# Patient Record
Sex: Male | Born: 1965 | Race: Black or African American | Hispanic: No | Marital: Married | Smoking: Never smoker
Health system: Southern US, Community
[De-identification: ages and names within clinical notes are randomized; demographics above are authoritative.]

## PROBLEM LIST (undated history)

## (undated) DIAGNOSIS — I10 Essential (primary) hypertension: Secondary | ICD-10-CM

## (undated) DIAGNOSIS — I2089 Other forms of angina pectoris: Secondary | ICD-10-CM

## (undated) DIAGNOSIS — I208 Other forms of angina pectoris: Secondary | ICD-10-CM

## (undated) HISTORY — DX: Other forms of angina pectoris: I20.8

## (undated) HISTORY — PX: LUMBAR FUSION: SHX111

## (undated) HISTORY — DX: Other forms of angina pectoris: I20.89

---

## 2005-10-05 ENCOUNTER — Encounter: Payer: Self-pay | Admitting: Family Medicine

## 2005-10-23 ENCOUNTER — Encounter: Payer: Self-pay | Admitting: Family Medicine

## 2005-10-24 ENCOUNTER — Encounter: Payer: Self-pay | Admitting: Family Medicine

## 2005-11-18 ENCOUNTER — Encounter: Payer: Self-pay | Admitting: Family Medicine

## 2007-05-17 ENCOUNTER — Ambulatory Visit: Payer: Self-pay | Admitting: Physical Medicine & Rehabilitation

## 2007-08-12 ENCOUNTER — Encounter
Admission: RE | Admit: 2007-08-12 | Discharge: 2007-11-10 | Payer: Self-pay | Admitting: Physical Medicine & Rehabilitation

## 2007-08-12 ENCOUNTER — Ambulatory Visit: Payer: Self-pay | Admitting: Physical Medicine & Rehabilitation

## 2007-08-20 ENCOUNTER — Ambulatory Visit: Payer: Self-pay | Admitting: Physical Medicine & Rehabilitation

## 2007-09-02 ENCOUNTER — Ambulatory Visit: Payer: Self-pay | Admitting: Physical Medicine & Rehabilitation

## 2007-09-09 ENCOUNTER — Ambulatory Visit: Payer: Self-pay | Admitting: Physical Medicine & Rehabilitation

## 2007-10-03 ENCOUNTER — Ambulatory Visit: Payer: Self-pay | Admitting: Physical Medicine & Rehabilitation

## 2007-10-11 ENCOUNTER — Ambulatory Visit: Payer: Self-pay | Admitting: Physical Medicine & Rehabilitation

## 2007-10-17 ENCOUNTER — Encounter
Admission: RE | Admit: 2007-10-17 | Discharge: 2007-11-28 | Payer: Self-pay | Admitting: Physical Medicine & Rehabilitation

## 2007-11-15 ENCOUNTER — Ambulatory Visit: Payer: Self-pay | Admitting: Physical Medicine & Rehabilitation

## 2007-11-29 ENCOUNTER — Ambulatory Visit: Payer: Self-pay | Admitting: Physical Medicine & Rehabilitation

## 2008-02-05 ENCOUNTER — Ambulatory Visit: Payer: Self-pay | Admitting: Family Medicine

## 2008-02-05 DIAGNOSIS — H6120 Impacted cerumen, unspecified ear: Secondary | ICD-10-CM

## 2008-02-05 DIAGNOSIS — H8110 Benign paroxysmal vertigo, unspecified ear: Secondary | ICD-10-CM

## 2008-02-05 DIAGNOSIS — F4320 Adjustment disorder, unspecified: Secondary | ICD-10-CM | POA: Insufficient documentation

## 2008-02-18 ENCOUNTER — Ambulatory Visit (HOSPITAL_COMMUNITY): Payer: Self-pay | Admitting: Psychology

## 2008-02-25 ENCOUNTER — Ambulatory Visit (HOSPITAL_COMMUNITY): Payer: Self-pay | Admitting: Psychology

## 2008-03-04 ENCOUNTER — Ambulatory Visit: Payer: Self-pay | Admitting: Family Medicine

## 2008-03-11 ENCOUNTER — Encounter (INDEPENDENT_AMBULATORY_CARE_PROVIDER_SITE_OTHER): Payer: Self-pay | Admitting: *Deleted

## 2008-03-11 ENCOUNTER — Encounter: Payer: Self-pay | Admitting: Family Medicine

## 2008-03-11 LAB — CONVERTED CEMR LAB
Cholesterol: 182 mg/dL
Triglycerides: 111 mg/dL

## 2008-03-12 ENCOUNTER — Encounter: Payer: Self-pay | Admitting: Family Medicine

## 2008-03-12 LAB — CONVERTED CEMR LAB
Albumin: 4.4 g/dL
BUN: 11 mg/dL
CO2: 30 meq/L
Calcium: 10.1 mg/dL
Chloride: 104 meq/L
Creatinine, Ser: 1.3 mg/dL
Potassium: 4.1 meq/L

## 2008-03-16 ENCOUNTER — Encounter: Payer: Self-pay | Admitting: Family Medicine

## 2008-03-24 ENCOUNTER — Ambulatory Visit (HOSPITAL_COMMUNITY): Payer: Self-pay | Admitting: Psychology

## 2008-03-24 ENCOUNTER — Encounter: Payer: Self-pay | Admitting: Family Medicine

## 2008-03-27 ENCOUNTER — Ambulatory Visit: Payer: Self-pay | Admitting: Family Medicine

## 2008-03-27 DIAGNOSIS — A499 Bacterial infection, unspecified: Secondary | ICD-10-CM

## 2008-04-03 ENCOUNTER — Encounter: Payer: Self-pay | Admitting: Family Medicine

## 2008-04-24 ENCOUNTER — Ambulatory Visit: Payer: Self-pay | Admitting: Physical Medicine & Rehabilitation

## 2008-05-11 ENCOUNTER — Encounter
Admission: RE | Admit: 2008-05-11 | Discharge: 2008-05-25 | Payer: Self-pay | Admitting: Physical Medicine & Rehabilitation

## 2008-05-15 ENCOUNTER — Encounter
Admission: RE | Admit: 2008-05-15 | Discharge: 2008-05-18 | Payer: Self-pay | Admitting: Physical Medicine & Rehabilitation

## 2008-05-18 ENCOUNTER — Ambulatory Visit: Payer: Self-pay | Admitting: Physical Medicine & Rehabilitation

## 2008-07-07 ENCOUNTER — Ambulatory Visit (HOSPITAL_COMMUNITY): Payer: Self-pay | Admitting: Psychology

## 2008-07-31 ENCOUNTER — Ambulatory Visit: Payer: Self-pay | Admitting: Physical Medicine & Rehabilitation

## 2008-08-19 ENCOUNTER — Ambulatory Visit: Payer: Self-pay | Admitting: Family Medicine

## 2008-08-19 DIAGNOSIS — J069 Acute upper respiratory infection, unspecified: Secondary | ICD-10-CM | POA: Insufficient documentation

## 2009-04-13 ENCOUNTER — Encounter: Payer: Self-pay | Admitting: Family Medicine

## 2009-06-24 ENCOUNTER — Ambulatory Visit: Payer: Self-pay | Admitting: Family Medicine

## 2009-06-24 DIAGNOSIS — D239 Other benign neoplasm of skin, unspecified: Secondary | ICD-10-CM | POA: Insufficient documentation

## 2009-06-28 ENCOUNTER — Encounter (INDEPENDENT_AMBULATORY_CARE_PROVIDER_SITE_OTHER): Payer: Self-pay | Admitting: *Deleted

## 2009-08-16 ENCOUNTER — Telehealth (INDEPENDENT_AMBULATORY_CARE_PROVIDER_SITE_OTHER): Payer: Self-pay | Admitting: *Deleted

## 2009-08-17 ENCOUNTER — Ambulatory Visit: Payer: Self-pay | Admitting: Family Medicine

## 2009-08-17 DIAGNOSIS — M545 Low back pain: Secondary | ICD-10-CM

## 2009-08-24 ENCOUNTER — Encounter: Admission: RE | Admit: 2009-08-24 | Discharge: 2009-09-23 | Payer: Self-pay | Admitting: Family Medicine

## 2009-08-30 ENCOUNTER — Encounter: Payer: Self-pay | Admitting: Family Medicine

## 2009-09-23 ENCOUNTER — Encounter: Payer: Self-pay | Admitting: Family Medicine

## 2009-10-13 ENCOUNTER — Ambulatory Visit: Payer: Self-pay | Admitting: Family

## 2009-10-13 DIAGNOSIS — H6690 Otitis media, unspecified, unspecified ear: Secondary | ICD-10-CM | POA: Insufficient documentation

## 2009-10-26 ENCOUNTER — Ambulatory Visit: Payer: Self-pay | Admitting: Family Medicine

## 2009-11-19 ENCOUNTER — Encounter: Payer: Self-pay | Admitting: Family Medicine

## 2010-01-28 ENCOUNTER — Ambulatory Visit: Payer: Self-pay | Admitting: Family Medicine

## 2010-01-28 DIAGNOSIS — E559 Vitamin D deficiency, unspecified: Secondary | ICD-10-CM | POA: Insufficient documentation

## 2010-01-28 DIAGNOSIS — R42 Dizziness and giddiness: Secondary | ICD-10-CM | POA: Insufficient documentation

## 2010-02-11 ENCOUNTER — Encounter: Payer: Self-pay | Admitting: Family Medicine

## 2010-02-14 LAB — CONVERTED CEMR LAB
Alkaline Phosphatase: 45 units/L (ref 39–117)
CO2: 29 meq/L (ref 19–32)
Cholesterol: 182 mg/dL (ref 0–200)
Creatinine, Ser: 1.33 mg/dL (ref 0.40–1.50)
Glucose, Bld: 112 mg/dL — ABNORMAL HIGH (ref 70–99)
HCT: 44.7 % (ref 39.0–52.0)
LDL Cholesterol: 121 mg/dL — ABNORMAL HIGH (ref 0–99)
MCHC: 34 g/dL (ref 30.0–36.0)
MCV: 88.3 fL (ref 78.0–100.0)
Platelets: 207 10*3/uL (ref 150–400)
RBC: 5.06 M/uL (ref 4.22–5.81)
Sodium: 142 meq/L (ref 135–145)
Total Bilirubin: 0.7 mg/dL (ref 0.3–1.2)
Total CHOL/HDL Ratio: 4.4
Total Protein: 6.9 g/dL (ref 6.0–8.3)
Triglycerides: 101 mg/dL (ref <150)
VLDL: 20 mg/dL (ref 0–40)

## 2010-06-07 NOTE — Miscellaneous (Signed)
Summary: PT Initial Summary/[facility]  PT Initial Summary/[facility]   Imported By: Lanelle Bal 09/02/2009 11:20:34  _____________________________________________________________________  External Attachment:    Type:   Image     Comment:   External Document

## 2010-06-07 NOTE — Assessment & Plan Note (Signed)
Summary: back pain   Vital Signs:  Patient profile:   45 year old male Height:      70.25 inches Weight:      198 pounds BMI:     28.31 O2 Sat:      99 % on Room air Pulse rate:   74 / minute BP sitting:   143 / 94  (left arm) Cuff size:   large  Vitals Entered By: Payton Spark CMA (August 17, 2009 11:14 AM)  O2 Flow:  Room air CC: Referral back to PT. Had back injury at work 11/02/2005 was seeing [facility] but self referred himself to Dr. Bryson Dames who then referred him to Leeanne Mannan. , Back Pain   Primary Care Provider:  Seymour Bars DO  CC:  Referral back to PT. Had back injury at work 11/02/2005 was seeing [facility] but self referred himself to Dr. Bryson Dames who then referred him to Leeanne Mannan.  and Back Pain.  History of Present Illness: 45 yo AAM presents for LBP injury in 2007 --> out of work until 2009.  Phone system and box fell on him. He was treated with injection (with Dr Jess Barters) which did not seem to help.  He did PT off and on at [facility] then with [facility].  He stopped PT the end of 2009.  He would like to restart PT. He went back to work as light duty in 09.  He still has some tender spots and weakness.  He also wants to start working out.  He would like to get back to full duty at work.    He is not taking any Tylenol/ NSAIDs or muscle relaxers.       This is a 45 year old man who presents with Back Pain.  The patient reports weakness, but denies fever, urinary incontinence, urinary retention, and dysuria.  The pain is located in the right low back and left low back.  The pain began at work and after a blow.  The pain is made better by inactivity.  Risk factors for serious underlying conditions include duration of pain > 1 month.    Current Medications (verified): 1)  None  Allergies (verified): No Known Drug Allergies  Past History:  Past Medical History: Reviewed history from 04/03/2008 and no changes required. Lumbar back pain hx  of 'syndrome x'  cards Dr Salomon Mast GC/Chl/RPR/HEPC/ HIV neg 11-09  exercise stress test 6-07: neg for ischemia, but + for CP.  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  Past Surgical History: Reviewed history from 02/05/2008 and no changes required. 3-06 lumbar diskectomy  Social History: Reviewed history from 02/05/2008 and no changes required. 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 Married to [place] with 4 kids. Never smoked.  Denies ETOH. No regular exercise. Fair diet. Not sexually active, wants STD testing.  Review of Systems      See HPI  Physical Exam  General:  alert, well-developed, well-nourished, and well-hydrated.   Head:  normocephalic and atraumatic.   Mouth:  pharynx pink and moist.   Neck:  full ROM and no masses.   Lungs:  Normal respiratory effort, chest expands symmetrically. Lungs are clear to auscultation, no crackles or wheezes. Heart:  Normal rate and regular rhythm. S1 and S2 normal without gallop,  murmur, click, rub or other extra sounds. Extremities:  no LE edema Neurologic:  DTRs symmetrical and normal.     Detailed Back/Spine Exam  Gait:    Normal heel-toe gait pattern bilaterally.    Lumbosacral Exam:  Inspection-deformity:    Normal Palpation-spinal tenderness:     tender midline at L5-S1 Range of Motion:    Forward Flexion:   80 degrees    Hyperextension:   20 degrees    Right Lateral Bend:   35 degrees    Left Lateral Bend:   35 degrees Sitting Straight Leg Raise:    Right:  negative    Left:  negative Sciatic Notch:    There is no sciatic notch tenderness. Toe Walking:    Right:  normal    Left:  normal Heel Walking:    Right:  normal    Left:  normal   Impression & Recommendations:  Problem # 1:  LOW BACK PAIN,  CHRONIC (ICD-724.2) Complicated by hx of lumbar diskectomy and traumatic incident at work in 07 which left him out of work for 2 yrs.  He did not improve after several LESIs with Dr Jess Barters (had updated MRI at the time ? 2008).  He continues to have some LBP w/o radiculopathy that is impairing ability to return to full duty.  He has not done home PT exercises.  He declined need for pain meds or muscle relaxers.  He did improve with PT, last done in 09.  Will restart PT for another 8 wks then eval again to see if he can go to full duty at work.  aVoid heavy lifting.  He really needs to adopt some low back exercises and core strengthening program for home use.   Orders: Physical Therapy Referral (PT)  Patient Instructions: 1)  referral put in for PT with Fannie Knee. 2)  You will get a call to set this up. 3)  Return to recheck in 8 wks -- will see if you are ready for FULL duty at work.

## 2010-06-07 NOTE — Consult Note (Signed)
Summary: [facility]  [facility]   Imported By: Lanelle Bal 11/26/2009 14:09:48  _____________________________________________________________________  External Attachment:    Type:   Image     Comment:   External Document

## 2010-06-07 NOTE — Assessment & Plan Note (Signed)
Summary: FEELING DIZZY--STARTED FRIDAY//VGJ--Rm 6   Vital Signs:  Patient profile:   45 year old male Height:      70.25 inches Weight:      194 pounds BMI:     27.74 Temp:     97.6 degrees F oral Pulse rate:   78 / minute Pulse rhythm:   regular Resp:     16 per minute BP sitting:   110 / 86  (right arm) Cuff size:   regular  Vitals Entered By: Mervin Kung CMA (October 13, 2009 10:45 AM) CC: Room 6  Dizziness with certain movements since Friday. Persistent nausea since Friday.   Primary Care Provider:  Seymour Bars DO  CC:  Room 6  Dizziness with certain movements since Friday. Persistent nausea since Friday.Bryan Walls  History of Present Illness: Bryan Walls is a 45 year old male who presents today with complaint of dizziness which started last friday.  Symptoms are worse with movement and are associated with constant nausea. Denies associated sinus pressure or ear pain.  He had similar episode last year for which he saw Dr. Cathey Endow, though it did not last this long.  Patient was treated with meclizine at that time which he did not feel helped him that much.  Denies associated weakness or numbness.    Allergies (verified): No Known Drug Allergies  Physical Exam  General:  Well-developed,well-nourished,in no acute distress; alert,appropriate and cooperative throughout examination Head:  Normocephalic and atraumatic without obvious abnormalities. No apparent alopecia or balding. Ears:  + red streaking on right TM, L TM normal Lungs:  Normal respiratory effort, chest expands symmetrically. Lungs are clear to auscultation, no crackles or wheezes. Heart:  Normal rate and regular rhythm. S1 and S2 normal without gallop, murmur, click, rub or other extra sounds. Neurologic:  No cranial nerve deficits noted. Station and gait are normal. Plantar reflexes are down-going bilaterally. DTRs are symmetrical throughout. Sensory, motor and coordinative functions appear intact.   Impression &  Recommendations:  Problem # 1:  BENIGN POSITIONAL VERTIGO (ICD-386.11) Assessment Deteriorated Will give trial of meclizine, and as needed zofran.  If no improvement with these measures, can consider referral to vestibular rehab. His updated medication list for this problem includes:    Meclizine Hcl 25 Mg Tabs (Meclizine hcl) ..... One tablet by mouth three times a day as needed for dizziness    Ondansetron Hcl 4 Mg Tabs (Ondansetron hcl) ..... One tablet by mouth three times a day as needed nausea  Problem # 2:  OTITIS MEDIA (ICD-382.9) Assessment: New Suspect mild R OM.  Give sxs of #1 will plan to treat with amoxicillin. His updated medication list for this problem includes:    Amoxicillin 500 Mg Caps (Amoxicillin) ..... One tablet by mouth three times a day x 10 days  Complete Medication List: 1)  Meclizine Hcl 25 Mg Tabs (Meclizine hcl) .... One tablet by mouth three times a day as needed for dizziness 2)  Amoxicillin 500 Mg Caps (Amoxicillin) .... One tablet by mouth three times a day x 10 days 3)  Ondansetron Hcl 4 Mg Tabs (Ondansetron hcl) .... One tablet by mouth three times a day as needed nausea  Patient Instructions: 1)  Please call if your symptoms worsen or do not improve. Prescriptions: ONDANSETRON HCL 4 MG TABS (ONDANSETRON HCL) one tablet by mouth three times a day as needed nausea  #30 x 0   Entered and Authorized by:   Lemont Fillers FNP   Signed  by:   Lemont Fillers FNP on 10/13/2009   Method used:   Electronically to        [facility] (retail)       [address]       [place], [place]  [address]       Ph: [phone]       Fax: [phone]   RxID:   [id number] AMOXICILLIN 500 MG CAPS (AMOXICILLIN) one tablet by mouth three times a day x 10 days  #30 x 0   Entered and Authorized by:   Lemont Fillers FNP   Signed by:   Lemont Fillers FNP on 10/13/2009   Method used:   Electronically to        [facility] (retail)       [address]       [place], [place]  [address]       Ph: [phone]       Fax: [phone]   RxID:   [id number] MECLIZINE HCL 25 MG TABS (MECLIZINE HCL) one tablet by mouth three times a day as needed for dizziness  #30 x 0   Entered and Authorized by:   Lemont Fillers FNP   Signed by:   Lemont Fillers FNP on 10/13/2009   Method used:   Electronically to        [facility] (retail)       [address]       [place], [place]  [address]       Ph: [phone]       Fax: [phone]   RxID:   [id number]   Current Allergies (reviewed today): No known allergies

## 2010-06-07 NOTE — Letter (Signed)
Summary: Primary Care Consult Scheduled Letter  [facility]  [address]   [place], [place] [address]   Phone: [phone]  Fax: [phone]      06/28/2009 MRN: [id number]  NYHEEM BINETTE [address] [place], [place]  [address]    Dear Mr. Buzan,      We have scheduled an appointment for you.  At the recommendation of Dr.BOWEN , we have scheduled you a consult with [facility]  [address] DR Katrinka Blazing  on FEBRUARY 28,2011 at 9:45AM.  Their address is[address], [place] . The office phone number is [phone].  If this appointment day and time is not convenient for you, please feel free to call the office of the doctor you are being referred to at the number listed above and reschedule the appointment.     It is important for you to keep your scheduled appointments. We are here to make sure you are given good patient care. If you have questions or you have made changes to your appointment, please notify us at  [phone], ask for HELEN.    Thank you,  Patient Care Coordinator [place] [facility]

## 2010-06-07 NOTE — Progress Notes (Signed)
Summary: PT referral   Phone Note Call from Patient   Caller: Patient [phone] Summary of Call: Pt would like referral to PT in our building. He was working w/ Fannie Knee in the past for a work related low back injury and feels he needs to continue.  Initial call taken by: Payton Spark CMA,  August 16, 2009 9:08 AM  Follow-up for Phone Call        I cannot refer him if I did not seem him for back pain. Follow-up by: Seymour Bars DO,  August 16, 2009 9:10 AM  Additional Follow-up for Phone Call Additional follow up Details #1::        Explained to Pt that we do not bill to workers comp. Pt then transferred to [facility] so she could further explain to Pt.  Additional Follow-up by: Payton Spark CMA,  August 16, 2009 9:42 AM

## 2010-06-07 NOTE — Assessment & Plan Note (Signed)
Summary: CPE   Vital Signs:  Patient profile:   45 year old male Height:      70.25 inches Weight:      195 pounds BMI:     27.88 O2 Sat:      98 % on Room air Pulse rate:   78 / minute BP sitting:   132 / 85  (left arm) Cuff size:   large  Vitals Entered By: Payton Spark CMA (January 28, 2010 4:02 PM)  O2 Flow:  Room air CC: CPE   Primary Care Kaysha Parsell:  Seymour Bars DO  CC:  CPE.  History of Present Illness: 45 yo WM presents for CPE.    He has no complaints today other than stress - related dyspepsia x 1 month and some recurring BPPV which he has been seeing Dr Arville Lime for at [facility].Marland Kitchen  Working a lot.  Hx of LBP from work related accident a few years ago.  He is due for fasting labs.  Denies fam hx of prostate or colon cancer or of premature heart dz.   Current Medications (verified): 1)  None  Allergies (verified): No Known Drug Allergies  Past History:  Past Medical History: Reviewed history from 04/03/2008 and no changes required. Lumbar back pain hx of 'syndrome x'  cards Dr Salomon Mast GC/Chl/RPR/HEPC/ HIV neg 11-09  exercise stress test 6-07: neg for ischemia, but + for CP.  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  Past Surgical History: Reviewed history from 02/05/2008 and no changes required. 3-06 lumbar diskectomy  Family History: Reviewed history from 03/04/2008 and no changes required. mother died cervical cancer, had diabetes father HTN 2 brothers healthy  Social History: Reviewed history from 02/05/2008 and no changes required. 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 Never smoked.  Denies ETOH. No regular exercise. Fair diet.  Review of Systems  The patient denies anorexia, fever, weight loss,  weight gain, vision loss, decreased hearing, hoarseness, chest pain, syncope, dyspnea on exertion, peripheral edema, prolonged cough, headaches, hemoptysis, abdominal pain, melena, hematochezia, severe indigestion/heartburn, hematuria, incontinence, genital sores, muscle weakness, suspicious skin lesions, transient blindness, difficulty walking, depression, unusual weight change, abnormal bleeding, enlarged lymph nodes, angioedema, breast masses, and testicular masses.    Physical Exam  General:  alert, well-developed, well-nourished, and well-hydrated.   Head:  normocephalic and atraumatic.   Eyes:  PERRLA Ears:  EACs patent; TMs translucent and gray with good cone of light and bony landmarks.  Nose:  no nasal discharge.   Mouth:  good dentition and pharynx pink and moist.   Neck:  no masses.   Lungs:  Normal respiratory effort, chest expands symmetrically. Lungs are clear to auscultation, no crackles or wheezes. Heart:  Normal rate and regular rhythm. S1 and S2 normal without gallop, murmur, click, rub or other extra sounds. Abdomen:  Bowel sounds positive,abdomen soft and non-tender without masses, organomegaly, no AA bruits Rectal:  small external hemorrhoids,  Normal sphincter tone. No rectal masses or tenderness. hemoccult neg.   Prostate:  Prostate gland firm and smooth, no enlargement, nodularity, tenderness, mass, asymmetry or induration. Pulses:  2+ radial and pedal pulses Extremities:  no LE edema Skin:  color normal and no suspicious lesions.   Cervical Nodes:  No lymphadenopathy noted Psych:  good eye contact,  not anxious appearing, and not depressed appearing.     Impression & Recommendations:  Problem # 1:  HEALTH MAINTENANCE EXAM (ICD-V70.0) Keeping healthy checklist for men reviewed.   BP at goal.  BmI 27 = overwt. Update fasting labs. Tdap is UTD. DRE normal.  PSA with fasting labs. Trial of Acciphex for dyspepsia, if not improved in 2 wks, will call. He is to f/u  with Dr Arville Lime for his BPPV.  Other Orders: T-CBC No Diff ([id number]) T-Comprehensive Metabolic Panel [id number]) T-Lipid Profile [id number]) T-PSA ([id number]) T-Vitamin D (25-Hydroxy) ([id number]) Hemoccult Guaiac-1 spec.(in office) (02725)  Patient Instructions: 1)  Update fasting labs. 2)  Will call you w/ results. 3)  Try Eppley Maneuver at home for BPPV and f/u with Dr Arville Lime if dizziness continues. 4)  Try Acciphex once daily for dyspepsia. 5)  If abdominal pain has not improved in 2 wks, call me back.

## 2010-06-07 NOTE — Assessment & Plan Note (Signed)
Summary: scar   Vital Signs:  Patient profile:   45 year old male Height:      70.25 inches Weight:      194 pounds BMI:     27.74 O2 Sat:      97 % on Room air Temp:     98.3 degrees F oral Pulse rate:   86 / minute BP sitting:   127 / 82  (right arm) Cuff size:   regular  Vitals Entered By: Payton Spark CMA/April (June 24, 2009 3:27 PM)  O2 Flow:  Room air CC: f/u on mole on leg   Primary Care Provider:  Seymour Bars DO  CC:  f/u on mole on leg.  History of Present Illness: 45 yo AAM presents for f/u a skin lesion on his L calf that started 5 yrs ago after he got cut by an escalator.  The scar has gradually increased in size and is a bit itchy.  He denies any drainage.  Denies a hx of keloids or hypertrophic scars.    Current Medications (verified): 1)  Hydrocodone-Homatropine 5-1.5 Mg/22ml Syrp (Hydrocodone-Homatropine) .... 5 Ml By Mouth At Bedtime As Needed Cough  Allergies (verified): No Known Drug Allergies  Past History:  Past Medical History: Reviewed history from 04/03/2008 and no changes required. Lumbar back pain hx of 'syndrome x'  cards Dr Salomon Mast GC/Chl/RPR/HEPC/ HIV neg 11-09  exercise stress test 6-07: neg for ischemia, but + for CP.  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  Social History: Reviewed history from 02/05/2008 and no changes required. 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 Never smoked.  Denies ETOH. No regular exercise. Fair diet. Not sexually active, wants STD testing.  Review of Systems      See HPI  Physical Exam  General:  alert, well-developed, well-nourished, and well-hydrated.   Skin:  1 x 1 raised hyperpigmented hard 'scar' lesion over the proximal L calf with dryness and  central crater.     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 Send to derm for excisional removal.   Orders: Dermatology Referral (Derma)  Complete Medication List: 1)  Hydrocodone-homatropine 5-1.5 Mg/9ml Syrp (Hydrocodone-homatropine) .... 5 ml by mouth at bedtime as needed cough

## 2010-06-07 NOTE — Assessment & Plan Note (Signed)
Summary: f/u BPPV/ LBP   Vital Signs:  Patient profile:   45 year old male Height:      70.25 inches Weight:      200 pounds BMI:     28.60 O2 Sat:      100 % on Room air Pulse rate:   66 / minute BP sitting:   128 / 90  (left arm) Cuff size:   large  Vitals Entered By: Payton Spark CMA (October 26, 2009 8:27 AM)  O2 Flow:  Room air CC: F/U back pain. Doing better.    Primary Care Provider:  Seymour Bars DO  CC:  F/U back pain. Doing better. Marland Kitchen  History of Present Illness: 45 yo AAM presents for f/u LBP flare up and BPPV.    He was treated for serous OM and BPPV with only meclizine in early June.  He still has dizziness now that is reproducible.  He denied having a viral infection prior to this.  He has had this several x's in the past.  Denies any HAs, blurry vision or nausea/ vomitting.  Sitting still helps the rotational vertigo go away.  The Abx did not seem to help.  Denies any neck pain.  He completed PT for his LBP which did help.  He plans to start working out after his BPPV improves.  He is off all meds for back pain.    Current Medications (verified): 1)  None  Allergies (verified): No Known Drug Allergies  Past History:  Past Medical History: Reviewed history from 04/03/2008 and no changes required. Lumbar back pain hx of 'syndrome x'  cards Dr Salomon Mast GC/Chl/RPR/HEPC/ HIV neg 11-09  exercise stress test 6-07: neg for ischemia, but + for CP.  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  Past Surgical History: Reviewed history from 02/05/2008 and no changes required. 3-06 lumbar diskectomy  Social History: Reviewed history from 02/05/2008 and no changes required. 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 Never smoked.   Denies ETOH. No regular exercise. Fair diet. Not sexually active, wants STD testing.  Review of Systems      See HPI  Physical Exam  General:  alert, well-developed, well-nourished, and well-hydrated.   Head:  normocephalic and atraumatic.   Eyes:  pupils equal, pupils round, and pupils reactive to light.   Ears:  EACs patent; TMs translucent and gray with good cone of light and bony landmarks.  Nose:  no nasal discharge.   Mouth:  pharynx pink and moist.   Neck:  supple, full ROM, and no masses.   Lungs:  Normal respiratory effort, chest expands symmetrically. Lungs are clear to auscultation, no crackles or wheezes. Heart:  Normal rate and regular rhythm. S1 and S2 normal without gallop, murmur, click, rub or other extra sounds. Msk:  full active L spine ROM with neg seated straight leg raise, normal gait Extremities:  no LE edema Neurologic:  gait normal and DTRs symmetrical and normal.   + Producer, television/film/video with head rotation to the R and neck extenstion -- dizziness and horizontal nystagmus.   Skin:  color normal.     Impression & Recommendations:  Problem # 1:  BENIGN POSITIONAL VERTIGO (ICD-386.11) REcurring BPPV, this being the longest and most severe episode x 10 days  now. Able to reproduce with Gilberto Better today.  Given h/o on Eppley manuever to do at home for treatment.  Already has RX meds below. Due to recurrence and dizziness with neck flexion also, will send to ENT for further eval and tx. The following medications were removed from the medication list:    Meclizine Hcl 25 Mg Tabs (Meclizine hcl) ..... One tablet by mouth three times a day as needed for dizziness    Ondansetron Hcl 4 Mg Tabs (Ondansetron hcl) ..... One tablet by mouth three times a day as needed nausea  Orders: ENT Referral (ENT)  Problem # 2:  LOW BACK PAIN, CHRONIC (ICD-724.2) Acute on chronic LBP much improved after PT --> needs to continue home rehab program. Off all RX  meds.  Patient Instructions: 1)  ENT referral made for BPPV. 2)  OK to use your meclizine and do the Eppley maneuvers at home. 3)  Continue home rehab program for chronic back pain.   4)  Return for a PHYSICAL in 3 mos.

## 2010-06-07 NOTE — Miscellaneous (Signed)
Summary: PT Discharge/[facility]  PT Discharge/[facility]   Imported By: Lanelle Bal 10/11/2009 09:11:58  _____________________________________________________________________  External Attachment:    Type:   Image     Comment:   External Document

## 2010-09-20 NOTE — Procedures (Signed)
Bryan Walls, Bryan Walls             ACCOUNT NO.:  [id number]   MEDICAL RECORD NO.:  [id number]          PATIENT TYPE:  REC   LOCATION:  [facility]                          FACILITY:  [facility]   PHYSICIAN:  Erick Colace, M.D.DATE OF BIRTH:  1965-10-17   DATE OF PROCEDURE:  09/02/2007  DATE OF DISCHARGE:                               OPERATIVE REPORT   PROCEDURE:  Bilateral L5 dorsal ramus injection, bilateral L4 medial  branch block, bilateral L3 medial branch block under lumbar fluoroscopic  guidance and sacral fluoroscopic guidance.   INDICATIONS:  Lumbar axial pain, lumbar spondylosis without myelopathy.   The pain is only partially responsive to medication management and other  conservative care. Recent MRI demonstrating annular tear, question  whether pain is diskogenic versus facetogenic.   Informed consent was obtained after describing the risks and benefits of  the procedure with the patient. These include bleeding, bruising,  infection, temporary or permanent paralysis and he elects proceed.  The  patient placed prone on fluoroscopy table.  Betadine prep, sterile  drape, a 25-gauge inch and a half needle was used to anesthetize the  skin and subcutaneous tissue with 1% lidocaine x2 mLs at each of 6  sites.  Then a 22-gauge 3-1/2 inch spinal needle was inserted,  first  starting at the left S1 SAP sacral ala, bone contact made and confirmed  with lateral imaging.  Omnipaque 180 x 0.5 mL demonstrated no  intravascular uptake and 0.5 mL of dexamethasone lidocaine solution  injected. The solution containing 1 mL of 4 mg per mL dexamethasone and  2 mL of 2%MPF lidocaine.  In the left L5 SAP, the transverse process  junction was targeted, bone contact made and confirmed with lateral  imaging.  Omnipaque 180 x 0.5 mL demonstrated no intravascular uptake  and then 0.5 mL of dexamethasone lidocaine solution was injected.  The  left L4 SAP transverse process junction targeted, bone  contact made,  confirmed with lateral imaging.  Omnipaque 180 x 0.5 mL demonstrated no  intravascular uptake and 0.5 mL of the dexamethasone lidocaine solution  was injected.  The same procedure was repeated at corresponding levels  on the right side using the same needle injectate and at technique. If  this was of no benefit, consider epidural next visit translaminar  targeting the annular tear level.   Will start some physical therapy in the meantime.      Erick Colace, M.D.  Electronically Signed     AEK/MEDQ  D:  09/02/2007 13:04:24  T:  09/02/2007 13:19:49  Job:  [id number]

## 2010-09-20 NOTE — Assessment & Plan Note (Signed)
A 45 year old male with lumbar post laminectomy syndrome as well as  thoracolumbar myofascial pain syndrome.  I last saw him on May 18, 2008.  He has been trying to work out a way to work at home rather than  having to drive.  He states that driving aggravates his pain.  He states  that using his sick leave time and other free time to breakup full work  week has been helpful for him.  He has had no new injuries.  No new  medical problems.  He states he is still on the workers compensation  system.   In the interval time, I also talked to a physician from the insurance  company and he asked me whether or not getting out and stretching during  driving to work would allow the patient to be more comfortable.  I did  indicate yes from a medical standpoint.   His average pain at last visit was 7, this time he states it is at 10.  He does appear comfortable; however, no acute distress, moving about the  same as last time.  His pain interference score is at 0.   Pain will improve with rest and exercise.   He is not on any prescription pain medications.  He states that he sees  psychologist for behavioral health issues.   His Oswestry score today 52%.   EXAMINATION:  No acute distress.  Mood and affect appropriate.  Gait is  normal.  He can now walk toe walk and heel walk.   His motor strength is full in the upper and lower extremities.  Deep  tendon reflexes are normal.  His back has some tenderness to palpation  in the lumbosacral junction.   IMPRESSION:  1. Lumbar post laminectomy syndrome with chronic pain.  2. Thoracolumbar myofascial pain syndrome causing muscular pain as      well.  I will not make any changes in his restrictions.  I did      indicate to him that.  There has really been no change in his      clinical status for quite some time.  He is to continue on his home      exercise program.  Getting out and stretching every 20-30 minutes      with driving may be  helpful in this situation.   I will see him back on a p.r.n. basis.      Erick Colace, M.D.  Electronically Signed     AEK/MedQ  D:  07/31/2008 12:06:51  T:  08/01/2008 01:44:41  Job #:  [id number]   cc:   Zelphia Cairo. Alto Denver, M.D.  Fax: [phone]

## 2010-09-20 NOTE — Assessment & Plan Note (Signed)
HISTORY OF PRESENT ILLNESS:  Bryan Walls is a 45 year old male with  lumbar postlaminectomy syndrome.  He has back pain dating back 20 years  due to [employer] accident.  He was treated in the 1990s with epidural  injections and other conservative care.  He did have another episode of  increased back and leg pain in 2006, showing very large extruded disk L5-  S1.  Underwent discectomy in March 2006.  Follow up MRI in October 2006  demonstrated disk bulge at L4-L5 and L5-S1 with removal of the prior  disk extrusion.  He returned to work light duty in November 2006, but  had another back injury seven months later in June 2007 when a phone  system fell on top of him.  He was going through physical rehabilitation  versed in aquatic, and just was starting land-based therapy.  This was  done in conjunction with acupuncture treatments, and was doing fairly  well prior to going out of state visiting his mother who is requiring  physical assistance due to health problems of her own.  He returned to  visit me approximately three months later stating that he had an  exacerbation in his low back pain.  When I saw him, he had marked  limitation of getting in and out of the chair, walking very slowly,  tenderness with light palpation in the lumbar and sacral areas and  reduction of range of motion 25%.  He did not have any focal  neurological changes.  No signs of radiculopathy, but because of the  abrupt change, I did order MRI of his lumbar spine, and he is here to  review this with me today.  Since I last saw him approximately one week  ago, his pain has remained at a 10/10 level.  He has been using Flexeril  and Naprosyn for pain.   PHYSICAL EXAMINATION:  VITAL SIGNS:  Blood pressure 127/79, pulse 75,  respirations 18, O2 saturation 100% on room air.  GENERAL:  No acute distress.  Mood and affect mildly anxious.  Back has  tenderness to palpation of lumbar paraspinals.  His range of motion  limitation is 25% forward flexion/extension, lateral rotation and  bending.  Motor strength is 4+ bilateral hip flexor, knee extensor,  ankle dorsiflexor.  Knee and ankle range of motion and hip range of  motion are normal.  Straight leg raising test is negative.  Deep tendon  reflexes are normal.   STUDIES:  Review of lumbar MRI, I had the report, not actual films, L3-4  minor diffuse spondylotic disk bulge.  He does have short pedicles, mild  central stenosis, and mild left foraminal narrowing at L4-5, mild  diffuse spondylytic disk bulging, small hyperintense zone, posterior  central annulus consistent with annular tear with some mild foraminal  and lateral recess narrowing at L5-S1 evidence of prior right  hemilaminectomy and discectomy.  Postoperative scar abutting right  transversion of S1 nerve root.  Moderate bilateral neural foraminal  narrowing.  I do have the report from his lumbar myelogram February 20, 2006, which is his previous most recent imaging study.  Given that this  was a CT myelogram and not an MRI, difficult to make direct comparison.  Facet changes were noted.  Mild to moderate central canal stenosis noted  at L3-4, facet changes noted at L4-5 and here it was read as moderate to  severe central canal stenosis.  L5-S1 postoperative changes, pushing  upon right S1nerve root.   Overall,  only new finding is the annular defect.  These are at times  asymptomatic but also can be associated with pain which can last up to a  couple of years.  There is no way of saying that whether the patient's  current exacerbation is due to annular tear.  His examination has  combined issues consistent with both discogenic as well as facet  mediated pain given that he has limitations both with flexion and  extension.  For further help to resolve this issue, will do diagnostic  medial branch block from the lumbar spine L4-L5-S1 level.   I will schedule this in the next week or two.  For  now, I think we will  hold off on sending him back to work, although, this is still the  overall goal.  He may need some additional physical therapy prior to  depending on how he responds to treatment.  Will continue the Flexeril  and Naprosyn.      Bryan Walls, M.D.  Electronically Signed     AEK/MedQ  D:  08/20/2007 14:06:42  T:  08/20/2007 14:32:03  Job #:  [id number]   cc:   Zelphia Cairo. Alto Denver, M.D.  Fax: [phone]   [employer] Suzy Bouchard  [employer]  [address]  [place], [place] [address]

## 2010-09-20 NOTE — Assessment & Plan Note (Signed)
Bryan Walls is a 45 year old male who has a lumbar postlaminectomy  syndrome as well as thoracolumbar myofascial pain.  I last saw him in  July 2009.  He has gone back to work with light duty restrictions for  the last 2 months.  He continues with school.  He does a lot of sitting  in association with school.  At work, he does have the freedom to change  positions every half hour or so.  His pain is worse with walking,  bending, sitting, inactivity, and standing; improves with rest, heat,  and therapy.  He has not been exercising other than doing a few  stretches at work.   He has had no new medical problems in the interval time.  He drives.  He  climb steps.  He does housework and yard work.   REVIEW OF SYSTEMS:  Otherwise, negative.  No bowel or bladder  dysfunction.  No lower extremity weakness.   PHYSICAL EXAMINATION:  VITAL SIGNS:  Weight is 194 pounds.  GENERAL:  In no acute distress.  BACK:  Some tenderness to palpation in lumbosacral junction.  He has  about 25-50% of normal range of motion, forward flexion and extension.  EXTREMITIES:  Lower extremity strength is normal.  Deep tendon reflexes  are normal.  Sensation is normal and lower extremity range of motion is  normal.   Ambulation is normal.  He is able to toe walk and heel walk.  No  evidence of toe drag or knee instability.  Extremities show no evidence  of edema.   IMPRESSION:  Lumbar postlaminectomy syndrome with chronic postoperative  pain.  Overall, he is functioning well, but has stopped doing regular  exercise and I believe this is contributing to increased back stiffness.   We went over the importance of an exercise program and I will ask him to  see Physical Therapy for no more than 4 visits to review his exercise  program, modify/adjust to his current schedule.   He can continue as is on his over-the-counter medications, which is just  an occasional Tylenol.  He is using some Lidoderm patches as well.   I do not think anymore diagnostic procedures are needed.  No injection  procedures are needed at this time.   I will not schedule a followup unless there are any new problems, he can  call.      Bryan Walls, M.D.  Electronically Signed     AEK/MedQ  D:  04/24/2008 09:50:23  T:  04/25/2008 01:12:45  Job #:  [id number]   cc:   Glendon Axe

## 2010-09-20 NOTE — Assessment & Plan Note (Signed)
A 45 year old male with lumbar post-laminectomy syndrome as well as  thoracolumbar myofascial pain syndrome.  I last saw him on April 24, 2008.  He has been at work with light duty restrictions 2 months.  He  does a lot of sitting and he states that now he does not have the  freedom to change positions every half hour or so which he stated he had  before.  He states he just has a morning break, lunchtime, and an  afternoon break.  He is asking for a form to be filled out for his  workers' compensation to allow these reasonable accommodations to be  made permanent.   He has had a couple of more physical therapy sessions with his  therapist.  He has about 2 sessions left.  He is asking to see whether  he can have permanent physical therapy.   His average pain is around 7, it interferes with activity at a moderate  level.  He can walk 15 minutes at a time.  He climbs steps.  He drives.  He is working as a Psychiatric nurse.  He is also going to school at  night for an advanced degree.   REVIEW OF SYSTEMS:  Weakness, numbness, and trouble walking.   On examination, in general, no acute distress.  Mood and affect  appropriate.  His gait is normal.  He is in no acute distress.   I have filled out his form today, medical documentation for reasonable  accommodation.  I do feel that he would function optimally with the  ability to change positions every half hour from sit to stand and stand  to sit.  In terms of driving, not exceeding 20 minutes one way would  also help with his overall pain levels.  I do not think necessarily that  any physical therapy on a permanent basis would be any better than  learning a good home exercise program.  He certainly has no special  needs in that regard for one-on-one hands on therapy given that he has  no significant neurologic or psychologic deficit.   I will see him back on a p.r.n. basis.   He will continue his home exercise program at  home.      Bryan Walls, M.D.  Electronically Signed     AEK/MedQ  D:  05/18/2008 15:56:53  T:  05/19/2008 07:06:56  Job #:  [id number]

## 2010-09-20 NOTE — Assessment & Plan Note (Signed)
DATE OF INJURY:  November 02, 2005   HISTORY:  Mr. Bryan Walls is a 45 year old male who I have been treating  for  low back problems.  He is status post diskectomy for L5-S1 disk in 2006.  He returned to light duty in November of 2006, but had another back  injury 7 months later in June of 2007, when a phone system fell on top  of him.  He has gone through physical rehabilitation with aquatic  therapy and was just starting land-based therapy, however, was lost to  follow up for about 3 months during which time he stated he had an  exacerbation of a low back pain.  An MRI showed an annular defect, which  is difficult to say when this became present.  He does appear to have  discogenic pain; medial branch blocks, bilateral L5 dorsal ramus  injection, bilateral L4 medial branch block, and bilateral L3 medial  branch block on September 02, 2007, demonstrated no significant improvement.   Restart in physical therapy.  He has been going to PT [place] with  Leeanne Mannan.  He has attended 7 treatments over 4 weeks.  He missed a  week due to a family death in [place].  He has been working on [employer].  Speaking with the physical therapist, he does not  appear to be complying with his home exercise program very well and any  activity seems to aggravate his pain.  He likes to just receive hot pack  treatments.   REVIEW OF SYSTEMS:  Positive for weakness, numbness, and trouble  walking.   PHYSICAL EXAMINATION:  VITAL SIGNS:  Blood pressure 126/86, pulse 75,  and weight 191 pounds.  GENERAL:  No acute distress.  Mood and affect appropriate.  His back has  tenderness to palpation at the lumbosacral junction.  He has 75% range  forward flexion/extension.  He has normal deep tendon reflex, normal  strength in lower extremities, and normal range of motion.  EXTREMITIES:  Without edema.   IMPRESSION:  Lumbar axial pain, likely discogenic, associated with  annular tear.  I discussed with  him the need to be more active in his  treatment program and that he can get a moist compress hat he can use at  home at a local pharmacy.  I will see him back in 2 weeks and if no  further significant progress in PT, we will put him in maximum medical  improvement and send him back to Dr. Alto Denver.   No medications at this time other than over the counters.      Erick Colace, M.D.  Electronically Signed     AEK/MedQ  D:  11/15/2007 09:27:25  T:  11/16/2007 07:40:46  Job #:  [id number]   cc:   [employer]  [address],  [address]  [place], [place]  [address]  [place]

## 2010-09-20 NOTE — Assessment & Plan Note (Signed)
REASON FOR VISIT:  Bryan Walls returns today after a long absence. He was  to follow up in one week after I did an acupuncture treatment on May 17, 2007.  He is here three months later.  He states that his mother has  been ill and he went out of state to help care for her in [place]. He  states he has had an exacerbation of his low back pain since that time.  He states he has had onset of neck pain as well.  He has not had any  trauma, no motor vehicle accidents, no lifting injuries.   When I last saw him he was doing quite well and we were trying to get  him back starting with return to work date in January, the 26th.  He was  going through some physical therapy prior to that time.   The patient is concerned that he has done something to his back to make  it worse. He has no lower extremity symptoms, no numbness or tingling or  weakness in the lower extremities.  He has really no significant  exacerbating factors other than activity.  He can walk 10 to 15 minutes  at a time, not climbing steps. He drives.   REVIEW OF SYSTEMS:  His review of systems he checks off numbness and  tingling but really does not have any on further questioning. He has  trouble walking and spasms in his back.  He does note urinary hesitancy.  He does not feel like he empties his bladder completely.  He thinks this  has been going on for a little while.  He is still voiding however, no  burning with urination, no fevers or chills.   SOCIAL HISTORY:  He continues to live with his wife and four children.   PHYSICAL EXAMINATION:  VITAL SIGNS: His blood pressure is 119/83, pulse  73, respiratory rate 18, oxygen saturation 100% on room air.  Orientation x3.  Gait is with a limp, bent forward at the hips.   His back has tenderness with even light palpation starting at L4 going  down to about S1 area.  His range of motion is limited to about 25%  forward flexion and extension, lateral rotation and bending.  His  motor  strength is 4 in bilateral lower extremities, normal deep tendon  reflexes, normal sensation to pinprick.  His hip, knee and ankle range  of motion is normal.  No evidence of joint effusions.  No tenderness to  palpation of the lower extremities.   IMPRESSION AND PLANS:  Exacerbation of lumbar axial pain.  No signs of  sciatica. My only concern is his reports of urinary hesitancy and for  this reason would like to do a lumbar spine MRI with and without  contrast.  If he develops urinary retention he has been instructed to go  to the emergency department.  He understands this instruction.  In terms  of medications I have written for some Flexeril 5 p.o. t.i.d. and  Naprosyn 500 b.i.d. for two weeks until I see him back and then further  assess.  I did not do acupuncture today.      Erick Colace, M.D.  Electronically Signed     AEK/MedQ  D:  08/12/2007 16:31:42  T:  08/12/2007 17:29:05  Job #:  [id number]   cc:   Zelphia Cairo. Alto Denver, M.D.  Fax: [phone]   Attn:  Suzy Bouchard [employer]  [address] 

## 2010-09-20 NOTE — Assessment & Plan Note (Signed)
Mr. Vestal returns today with a form that was sent to him by the [employer]  [employer] of [employer] dated 09/04/2007.  He would like for me to address  the questions on this form.  Claim #[id number].   HISTORY:  A 45 year old male with a complex past medical history in  regards to his back.  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  Also treated by a  private physician in the [place] area in the 1990s.  Had diagnostic  imaging, MRIs, diskogram.  He had some epidural steroid injections which  resulted in temporary improvement.  He moved to the [place], [place] area in approximately 2004 and was working in the [employer]  He had an increase in back and leg pain in 2006.  He had a  large extruded disk at L5-S1, superior migration of the extruded disk  and underwent diskectomy in [facility] March 2006.  Back surgery was  covered under the [facility].  Followup MRI October 2006  showed disk bulges L4-5 and L5-S1, but disk extrusion was removed.  He  returned light duty in November 2006, but then seven months later  sustained a back injury June 2007 when he states a phone system weighing  100 pounds fell on top of him.  He was treated in [facility] January  2008 through March 2008 in the [facility].  He has had an IME cleared for sedentary work.  He was  seen by Dr. Kathrine Haddock from occupational medicine and was referred  to me for pain management.  He has sought non medication treatments.  We  started acupuncture in September of 2008.  He had treatments January 30, 2007, February 01, 2007 and February 08, 2007.  He started aquatic  exercise in October 2008 increasing his activity level.  He was going to  school in a community college.  He had another acupuncture February 22, 2007, March 01, 2007 and March 08, 2007.  There was some delay in  his aquatic  therapy starting.  He had a couple of visits really starting  at the end of October/the beginning of November.  He had another  acupuncture treatment March 22, 2007.  On March 29, 2007 I  reviewed a full time permanent light duty job offer that the patient  had.  I agreed with the sedentary office type restriction and job  description, felt him to be progressing toward return.  Another  acupuncture March 29, 2007.  Additional acupuncture treatments  April 10, 2007 and April 12, 2007.  I felt he would be ready to work  by January 2009.  He additional acupuncture treatments April 19, 2007  and April 26, 2007.  He had a mild recurrence of his pain May 10, 2007.  He states that he quit going to aquatic therapy, needed  reapproval and he was more sedentary.  I saw him again May 17, 2007.  I felt he was going to be ready to go back to work the end of January.   Then the patient was lost to follow-up, did not return to see me as  scheduled until April 2009.  He states he was out of town helping his  mother who is ill, but in the interval time felt like he had a lot more  pain without signs of  sciatica.  He had some urinary hesitancy and  because of that I checked a lumbar MRI without contrast.  I started him  on Flexeril and Naprosyn.  I had him come back to review the MRI.  A  small central annular tear at L4-5.  No significant new compressive  lesions on the spinal nerve roots.  No new extrusions.   As I indicated to the patient, it was difficult to tell the age of the  annular tear and whether or not it in fact represents the pain  generator.  Because he also had facet joint findings, we also performed  medial branch blocks.  Immediately getting off the table, he had no  significant improvement of pain.  He follows up today stating that in  fact it did help his pain level for about a day and a half of 60%  relief.   His examination today reveals a well-developed,  well-nourished male in  no acute distress.  He does move slowly when getting up out of the  chair, but walks without evidence of toe drag or knee instability.  He  has some paraspinal tenderness and limited range of motion in forward  flexion and extension about 50% range in either direction.  His lower  extremity strength, lower extremity reflexes, lower extremity range of  motion are all normal.  Sensation is normal.  Coordination is normal.  Extremities without edema.  Mood and affect are flat.  He appears tired.   Specifically in response to the letter:  Question #1 provide rationale  and medical opinion as to how Mr. Sanfilippo current workers compensation  accept condition of aggravation of degeneration of lumbar or lumbosacral  intervertebral disk, contusion of chest wall, reaction to spinal lumbar  puncture, other nervous system complications caused by recurrence of  injury.  Mr. Petrovic was released and able to start work by the end of  January by Dr. 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  Note, Mr. Gearhart failed to return  to accepted modified data entry position June 09, 2007 and still has  not returned to work until the present time and is now claiming  recurrence of injury even though he has not returned to work.   The aggravation of degeneration of lumbar or lumbosacral intervertebral  disk is defined as being aggravated by patient's clinical symptoms of  axial pain, reduction of range of motion, and annular tear noted on the  MRI.  As noted above, it is difficult to state what the age of the  annular tear is and certainly it could have occurred any time between  the last MRI sent over one year ago and the current one.   #2:  Explain how the new diagnosis of annular tear which was not  diagnosed previously are related to work injuries now diagnosed  during a  period of no work related to the accepted worker's compensation of  aggravated of degenerative of lumbar or lumbosacral intervertebral disk.  The annular tear diagnosis is based on MRI and clinical findings.  The  patient was not working at the time of his aggravation and as noted  above was lost to follow-up for about three months.   #3:  Has the accepted worker's compensation condition of aggravation or  degeneration of lumbar or lumbosacral intervertebral disk resolved or  returned to baseline prior to date of injury November 02, 2005.  No.  I  believe that the patient's current condition is not at the baseline that  he was at when I last saw him prior to his three month absence on  May 17, 2007.  At this point from a clinical standpoint, I would  recommend another set of medial branch blocks given equivocal results of  first set in which he had initially no pain relief, but then stated it  later gave him a 60% relief lasting over a day.  I will see him back for  this.  If we do not get worker's comp approval for this, the patient may  need to follow-up with Dr. Kathrine Haddock for reassessment.      Erick Colace, M.D.  Electronically Signed     AEK/MedQ  D:  09/10/2007 08:28:10  T:  09/10/2007 09:38:14  Job #:  [id number]   cc:   Workman's Comp Program Attn: Proofreader Employment Administrator, sports  [address]  [place], [place] [address]   Dr. Kathrine Haddock

## 2010-09-20 NOTE — Assessment & Plan Note (Signed)
DATE OF INJURY:  November 02, 2005.   Mr. Fotheringham follows up today.  We did 2 sets of lumbar medial branch  blocks.  This really did not give him any significant relief.  He does  have an MRI that did demonstrate a lumbar annular tear.  He has pain  with prolonged sitting.  Please refer to my prior visit date of Sep 10, 2007, where I answered the questions from the letter from the [employer] Workers' Comp Program.   In the interval time period, he has had no new medical problems.  He  grades his pain as 10/10, basically low back and posterior thigh, but  not below the knee.  He has had no numbness or tingling.  Per his  report, even though he notes it on his review of systems, I asked him  specifically about it.  His last employment date October 12, 2005.  He can  walk 15 minutes at a time.  He climbs steps.  He drives.  He walks  without any assistive device.  He is independent with all the self-care  and mobility.   His blood pressure is 133/79, pulse 84, weight 189 pounds.   PHYSICAL EXAMINATION:  He has mild tenderness at lumbosacral spine area.  His hip, knee, ankle range of motion is normal.  Deep tendon reflexes  are normal.  Lower extremity strength is normal.  Gait is normal.  Mood  and affect are appropriate.   IMPRESSION:  Lumbar discogenic pain.  He has had an exacerbation while  off work.  He was not getting any therapy at the time.  He was making  progress with physical therapy last fall and was about to get back to  work when he had an exacerbation.  As I noted to the patient, I do not  think any further injection therapy is needed at this time.  Also, I do  not think that any further acupuncture is needed at this time.   He never did get into a land-based program and was only in the water.  We will make a referral, do 3 times a week x4 weeks.  I will reassess  him midway through.  At this point as I discussed with the patient, I  think he is okay for  sedentary-type work.  Even though it is hurting, I  think he can physically do it, and I do not think it would lead to a  worsening of his condition.  He is not taking any medicines other than  over the counters.   Should I see no further progress the next time I see him, I would send  him back to Dr. Alto Denver for reevaluation.      Erick Colace, M.D.  Electronically Signed     AEK/MedQ  D:  10/11/2007 10:38:45  T:  10/12/2007 01:26:18  Job #:  [id number]   cc:   Drusilla Kanner  [employer]  Office of Workers' Comp Programs  [address], [address] [place], [place] [address]   Zelphia Cairo. Alto Denver, M.D.  Fax: [phone]

## 2010-09-20 NOTE — Assessment & Plan Note (Signed)
Mr. Vise follows up today.  He last saw me approximately 1 month ago.  He has a history of lumbar pain, prior history of an L5-S1 diskectomy.  He has been through [facility], has  been following with me, initially for acupuncture and physical therapy  upon referral from Dr. Alto Denver.  He last worked in June 2007.  He has been  cleared for sedentary-type work in the past.  He had an exacerbation of  pain while off work, in fact he was visiting family out of town when it  happened.  Repeated MRI showed a lumbar annular tear, difficult to know  how old it is.  Last imaging study was CT myelogram ordered by Dr. Shirley Muscat  over at [facility]   We have put him through some additional physical therapy, working with  Leeanne Mannan at [facility] in [place].  He  has had 12 treatments over 7 weeks, lumbar stabilization exercises,  transfers, and therapeutic exercise including aerobic conditioning and  modalities.  He has had some reduction of pain over that time from a 10  to a 7, however no change in functional status.   I spoke with Leeanne Mannan, question whether the patient was giving full  effort during physical therapy.  He has had no new medical problems  since I last saw him on November 15, 2007.   MEDICATIONS:  None.   PHYSICAL EXAMINATION:  His back has some tenderness in the lumbosacral  junction, range of motion limited to about 25% of normal range in  forward flexion and extension.  Lower extremity strength is normal.  Deep tendon reflexes are normal.  Lower extremity range of motion  normal.  Gait stiff when he gets up from chair but otherwise no evidence  of toe drag or knee instability.   IMPRESSION:  Lumbar degenerative disk.  I think he has plateaued from a  functional standpoint, maximal medical improvement from a rehabilitation  standpoint.  He has previously been cleared for sedentary-type work.  I  do not think he needs  any additional functional capacity evaluation at  this point.  I would like to send him back to Dr. Kathrine Haddock at  Occupational Medicine.  She can make further recommendations, and I will  see him back on a p.r.n. basis.  This was discussed with the patient,  and we will assist him in getting the appointment, which is in the  office next door.      Erick Colace, M.D.  Electronically Signed     AEK/MedQ  D:  11/29/2007 11:03:48  T:  11/30/2007 01:01:43  Job #:  [id number]   cc:   Zelphia Cairo. Alto Denver, M.D.  Fax: [phone]

## 2010-09-23 NOTE — Procedures (Signed)
NAMESARP, VERNIER             ACCOUNT NO.:  [id number]   MEDICAL RECORD NO.:  [id number]          PATIENT TYPE:  REC   LOCATION:  [facility]                          FACILITY:  [facility]   PHYSICIAN:  Erick Colace, M.D.DATE OF BIRTH:  02/27/1966   DATE OF PROCEDURE:  10/01/2008  DATE OF DISCHARGE:  05/18/2008                               OPERATIVE REPORT   PROCEDURE:  Bilateral L5 dorsal ramus injection, bilateral L4 medial  branch block, bilateral L3 medial branch block under fluoroscopic  guidance.   INDICATIONS:  Lumbar axial pain, post laminectomy syndrome.  He had  relief for about one and half days after prior medial branch block.  This is confirmatory.   Informed consent was obtained after describing risks and benefits of the  procedure with the patient.  These include bleeding, bruising, and  infection.  He elects to proceed and has given written consent.  The  patient placed prone on fluoroscopy table.  Betadine prep, sterile  drape, 25-gauge 1-1/2 needle was used to anesthetize the skin and subcu  tissue, 1% lidocaine with x2 mL and 22 gauge 3-1/2 inch spinal needle  was inserted under fluoroscopic guidance, starting in the left S1 SAP  sacral ala junction bone contact made, confirmed with lateral imaging.  Omnipaque 180 under live fluoro demonstrated no intravascular uptake.  Then, 0.5 mL dexamethasone lidocaine solution was injected.  This  consists of 4 mg/mL dexamethasone x 1 mL and 2 mL of 2% MPF lidocaine.  Then, left L5 SAP transverse junction target bone contact made,  confirmed with lateral imaging.  Omnipaque 180 x 0.5 mL demonstrated no  intravascular uptake.  Then, 0.5 mL of dexamethasone lidocaine solution  was injected in the the left L4 SAP transverse junction, targeted bone  contact made, confirmed with lateral imaging.  Omnipaque 180 under live  fluoro demonstrated no intravascular uptake.  Then, 0.5 mg of the  dexamethasone lidocaine solution was  injected.  The patient tolerated  the procedure well.  Pre and post injection vitals stable.  Post  injection instructions were given.  Pre and post injection level 10/10.      Erick Colace, M.D.  Electronically Signed     AEK/MEDQ  D:  10/01/2008 17:01:29  T:  10/02/2008 05:44:08  Job:  [id number]

## 2010-09-23 NOTE — Assessment & Plan Note (Signed)
Bryan Walls returns today.  He is a 44 year old male with low back and  low extremity pain initially from the 90s from a [place] related injury.  He  had a reinjury approximately 2 years ago where he had a large extruded  disk L5/S1, and underwent diskectomy at [facility].  This was covered by the [employer].  He had return to work  light duty November 2006, and then a back injury 7 months later in June  2007 when a phone system fell on top of him.  He went through [facility], Dr. Victoriano Lain.  He had some physical  therapy at that time, but really has not had any for around 10 months.   He has been getting some acupuncture treatment, which has been  benefiting him in terms of his low back pain, and we also discussed  remobilizing first through aquatic therapy, and through land based  therapy.   EXAMINATION:  His back has tenderness to palpation around L4-5 area in  the paraspinals.  He has good hip range of motion.  No pain over the  hips.  Good lower extremity strength.  Femoral stretch test causes axial  back pain.  This is accompanied with some back hyperextension.   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  Overall, he is getting relief with the acupuncture, but this needs to be  combined with physical therapy to optimize mobility.   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   He is looking for a treatment plan that I can send to Workers'  Compensation today, and estimated return to work.   The plan is at this point to start aquatic therapy 2 to 3 times a week  for 3 to 4 weeks followed by physical therapy land based with some  mobility exercises as well as aerobic conditioning exercises for another  3 to 4 weeks 2 to 3 times a week.  I will be seeing him once a week for  acupuncture during that time.  I anticipate him being able to go to work  in 2 months, i.e., April 15, 2007, or there about.   Over 50% of the office visit was spent counseling coordination of care.      Erick Colace, M.D.  Electronically Signed     AEK/MedQ  D:  02/13/2007 09:43:02  T:  02/13/2007 13:11:03  Job #:  [id number]   cc:   Department of [employer]  [address]  [place], [place] [address]   Workers' Compensation

## 2011-05-12 ENCOUNTER — Encounter: Payer: Self-pay | Admitting: *Deleted

## 2011-05-12 ENCOUNTER — Emergency Department (INDEPENDENT_AMBULATORY_CARE_PROVIDER_SITE_OTHER)
Admission: EM | Admit: 2011-05-12 | Discharge: 2011-05-12 | Disposition: A | Discharge status: Home / Self Care | Payer: PRIVATE HEALTH INSURANCE | Source: Home / Self Care | Attending: Emergency Medicine | Admitting: Emergency Medicine

## 2011-05-12 DIAGNOSIS — J069 Acute upper respiratory infection, unspecified: Secondary | ICD-10-CM

## 2011-05-12 DIAGNOSIS — R110 Nausea: Secondary | ICD-10-CM

## 2011-05-12 MED ORDER — PROMETHAZINE HCL 25 MG PO TABS: 25.0000 mg | ORAL_TABLET | Freq: Four times a day (QID) | ORAL | Status: AC | PRN

## 2011-05-12 MED ORDER — AZITHROMYCIN 250 MG PO TABS: ORAL_TABLET | ORAL | Status: AC

## 2011-05-12 NOTE — ED Notes (Signed)
Patient c/o dry cough, fever and congestion. No flu shot this year.

## 2011-05-12 NOTE — ED Provider Notes (Signed)
History     CSN: [id number]  Arrival date & time 05/12/11  1115   First MD Initiated Contact with Patient 05/12/11 1146      Chief Complaint  Patient presents with  . Cough    (Consider location/radiation/quality/duration/timing/severity/associated sxs/prior treatment) HPI Juandiego is a 46 y.o. male who complains of onset of cold symptoms for 4-5 days.  No sore throat + dry cough No pleuritic pain No wheezing + nasal congestion + post-nasal drainage + sinus pain/pressure No chest congestion No itchy/red eyes No earache No hemoptysis No SOB + chills/sweats No fever + nausea No vomiting No abdominal pain No diarrhea No skin rashes No fatigue No myalgias No headache    History reviewed. No pertinent past medical history.  Past Surgical History  Procedure Date  . Lumbar fusion     History reviewed. No pertinent family history.  History  Substance Use Topics  . Smoking status: Never Smoker   . Smokeless tobacco: Not on file  . Alcohol Use: No      Review of Systems  Allergies  Review of patient's allergies indicates no known allergies.  Home Medications   Current Outpatient Rx  Name Route Sig Dispense Refill  . AZITHROMYCIN 250 MG PO TABS  Use as directed 1 each 0  . PROMETHAZINE HCL 25 MG PO TABS Oral Take 1 tablet (25 mg total) by mouth every 6 (six) hours as needed for nausea. 24 tablet 0    BP 121/87  Pulse 74  Temp(Src) 97.9 F (36.6 C) (Oral)  Resp 14  Ht 6' (1.829 m)  Wt 189 lb (85.73 kg)  BMI 25.63 kg/m2  SpO2 99%  Physical Exam  Nursing note and vitals reviewed. Constitutional: He is oriented to person, place, and time. He appears well-developed and well-nourished.  HENT:  Head: Normocephalic and atraumatic.  Right Ear: Tympanic membrane, external ear and ear canal normal.  Left Ear: Tympanic membrane, external ear and ear canal normal.  Nose: Mucosal edema and rhinorrhea present.  Mouth/Throat: Posterior oropharyngeal  erythema present. No oropharyngeal exudate or posterior oropharyngeal edema.  Eyes: No scleral icterus.  Neck: Neck supple.  Cardiovascular: Regular rhythm and normal heart sounds.   Pulmonary/Chest: Effort normal and breath sounds normal. No respiratory distress.  Abdominal: Soft. There is no tenderness. There is no rigidity, no rebound, no guarding and no CVA tenderness.  Neurological: He is alert and oriented to person, place, and time.  Skin: Skin is warm and dry.  Psychiatric: He has a normal mood and affect. His speech is normal.    ED Course  Procedures (including critical care time)  Labs Reviewed - No data to display No results found.   1. Nausea   2. Acute upper respiratory infections of unspecified site       MDM  1)  Take the prescribed antibiotic as instructed. I also gave him a prescription for Phenergan and advised a bland diet for the next few days. 2)  Use nasal saline solution (over the counter) at least 3 times a day. 3)  Use over the counter decongestants like Zyrtec-D every 12 hours as needed to help with congestion.  If you have hypertension, do not take medicines with sudafed.  4)  Can take tylenol every 6 hours or motrin every 8 hours for pain or fever. 5)  Follow up with your primary doctor if no improvement in 5-7 days, sooner if increasing pain, fever, or new symptoms.     Lily Kocher, MD  05/12/11 1149 

## 2011-06-21 ENCOUNTER — Encounter: Payer: Self-pay | Admitting: *Deleted

## 2011-06-28 ENCOUNTER — Encounter: Payer: PRIVATE HEALTH INSURANCE | Admitting: Family Medicine

## 2011-10-17 ENCOUNTER — Emergency Department (HOSPITAL_BASED_OUTPATIENT_CLINIC_OR_DEPARTMENT_OTHER)
Admission: EM | Admit: 2011-10-17 | Discharge: 2011-10-17 | Disposition: A | Discharge status: Home / Self Care | Payer: No Typology Code available for payment source | Attending: Emergency Medicine | Admitting: Emergency Medicine

## 2011-10-17 ENCOUNTER — Encounter (HOSPITAL_BASED_OUTPATIENT_CLINIC_OR_DEPARTMENT_OTHER): Payer: Self-pay | Admitting: *Deleted

## 2011-10-17 DIAGNOSIS — Y92009 Unspecified place in unspecified non-institutional (private) residence as the place of occurrence of the external cause: Secondary | ICD-10-CM | POA: Insufficient documentation

## 2011-10-17 DIAGNOSIS — S51011A Laceration without foreign body of right elbow, initial encounter: Secondary | ICD-10-CM

## 2011-10-17 DIAGNOSIS — Z981 Arthrodesis status: Secondary | ICD-10-CM | POA: Insufficient documentation

## 2011-10-17 DIAGNOSIS — W268XXA Contact with other sharp object(s), not elsewhere classified, initial encounter: Secondary | ICD-10-CM | POA: Insufficient documentation

## 2011-10-17 DIAGNOSIS — S51009A Unspecified open wound of unspecified elbow, initial encounter: Secondary | ICD-10-CM | POA: Insufficient documentation

## 2011-10-17 MED ORDER — LIDOCAINE-EPINEPHRINE 2 %-1:100000 IJ SOLN
INTRAMUSCULAR | Status: AC
  Administered 2011-10-17: 1 mL
  Filled 2011-10-17: qty 1

## 2011-10-17 NOTE — ED Notes (Signed)
Patient fell on his steps at home and cut his right elbow.

## 2011-10-17 NOTE — ED Provider Notes (Signed)
History     CSN: [id number]  Arrival date & time 10/17/11  0148   First MD Initiated Contact with Patient 10/17/11 0153      Chief Complaint  Patient presents with  . Elbow Injury    (Consider location/radiation/quality/duration/timing/severity/associated sxs/prior treatment) HPI  Laceration right elbow on door frame at home just prior to arrival.  Bleeding controlled with dressing.  No other injury.  No numbness or tingling or weakness. Tetanus up to date.  Past Medical History  Diagnosis Date  . Syndrome X, cardiac     history    Past Surgical History  Procedure Date  . Lumbar fusion     Family History  Problem Relation Age of Onset  . Cancer Mother     cervical  . Diabetes Mother   . Hypertension Father     History  Substance Use Topics  . Smoking status: Never Smoker   . Smokeless tobacco: Not on file  . Alcohol Use: No      Review of Systems  All other systems reviewed and are negative.    Allergies  Review of patient's allergies indicates no known allergies.  Home Medications  No current outpatient prescriptions on file.  Temp(Src) 98.4 F (36.9 C) (Oral)  Physical Exam  Nursing note and vitals reviewed. Constitutional: He is oriented to person, place, and time. He appears well-developed and well-nourished.  Musculoskeletal: Normal range of motion.       Right elbow with dorsomedial 4 cm laceration.  Fingers all with two point discrimination in place.  Radial pulse 2+, full arom wrist and elbow, no point tenderness of elbow.   Neurological: He is alert and oriented to person, place, and time.  Skin: Skin is warm and dry.  Psychiatric: He has a normal mood and affect.    ED Course  LACERATION REPAIR Date/Time: 10/17/2011 2:13 AM Performed by: Hilario Quarry Authorized by: Hilario Quarry Consent: Verbal consent obtained. Written consent not obtained. Risks and benefits: risks, benefits and alternatives were discussed Consent given by:  patient Patient understanding: patient states understanding of the procedure being performed Patient identity confirmed: verbally with patient Time out: Immediately prior to procedure a "time out" was called to verify the correct patient, procedure, equipment, support staff and site/side marked as required. Body area: upper extremity Location details: right elbow Laceration length: 4 cm Foreign bodies: no foreign bodies Tendon involvement: none Nerve involvement: none Vascular damage: no Anesthesia: local infiltration Local anesthetic: lidocaine 1% with epinephrine Anesthetic total: 2 ml Patient sedated: no Preparation: Patient was prepped and draped in the usual sterile fashion. Irrigation solution: saline Irrigation method: syringe Amount of cleaning: standard Debridement: none Degree of undermining: none Skin closure: 4-0 Prolene Number of sutures: 5 Technique: simple Approximation: close Approximation difficulty: simple Patient tolerance: Patient tolerated the procedure well with no immediate complications.   (including critical care time)  Labs Reviewed - No data to display No results found.   No diagnosis found.          Hilario Quarry, MD 10/17/11 [id number]

## 2011-10-17 NOTE — Discharge Instructions (Signed)

## 2011-11-03 ENCOUNTER — Encounter: Payer: Self-pay | Admitting: *Deleted

## 2011-11-07 ENCOUNTER — Encounter: Payer: Self-pay | Admitting: Family Medicine

## 2011-11-07 ENCOUNTER — Ambulatory Visit (INDEPENDENT_AMBULATORY_CARE_PROVIDER_SITE_OTHER): Payer: PRIVATE HEALTH INSURANCE | Admitting: Family Medicine

## 2011-11-07 VITALS — BP 137/89 | HR 71 | Wt 182.0 lb

## 2011-11-07 DIAGNOSIS — E785 Hyperlipidemia, unspecified: Secondary | ICD-10-CM

## 2011-11-07 DIAGNOSIS — B360 Pityriasis versicolor: Secondary | ICD-10-CM

## 2011-11-07 MED ORDER — KETOCONAZOLE 2 % EX CREA: TOPICAL_CREAM | Freq: Every day | CUTANEOUS | Status: AC

## 2011-11-07 MED ORDER — KETOCONAZOLE 2 % EX CREA: TOPICAL_CREAM | Freq: Every day | CUTANEOUS | Status: DC

## 2011-11-07 NOTE — Progress Notes (Signed)
  Subjective:    Patient ID: NEILSON OEHLERT, male    DOB: Nov 20, 1965, 46 y.o.   MRN: [id number]  HPI He is here for rash on his back. He says it's been there at least since his early to mid 12s which means about 20 years. He says it does not bother him. It's not itchy. He says over the last couple years she's tried to take much better care of his health and decided to come in today to see if we can treat this as well.   Review of Systems     Objective:   Physical Exam  Constitutional: He appears well-developed and well-nourished.  Skin:       Tinea versicolor on his back.  Splotches of discoloration on his back.    Psychiatric: He has a normal mood and affect. His behavior is normal.          Assessment & Plan:  Tinea versicolor - gave reassurance about this benign condition. We will treat with 2% ketoconazole cream to apply once daily for 2 weeks. After that he can apply twice a week for another 3-4 weeks. I did explain to him that if he really has had this for 20 years but will probably take several months for the coloration to it looks more smooth. Gave him a handout. Call if the rash is not resolving.  Hyperlipidemia-he has not had a cholesterol level since 2011. At that time his LDL was elevated. I gave him a handout today to go to the lab and he is able to do we can make sure that this lump is updated.

## 2011-11-07 NOTE — Patient Instructions (Addendum)
Tinea Versicolor Tinea versicolor is a common yeast infection of the skin. This condition becomes known when the yeast on our skin starts to overgrow (yeast is a normal inhabitant on our skin). This condition is noticed as white or light brown patches on brown skin, and is more evident in the summer on tanned skin. These areas are slightly scaly if scratched. The light patches from the yeast become evident when the yeast creates "holes in your suntan". This is most often noticed in the summer. The patches are usually located on the chest, back, pubis, neck and body folds. However, it may occur on any area of body. Mild itching and inflammation (redness or soreness) may be present. DIAGNOSIS   The diagnosisof this is made clinically (by looking). Cultures from samples are usually not needed. Examination under the microscope may help. However, yeast is normally found on skin. The diagnosis still remains clinical. Examination under Wood's Ultraviolet Light can determine the extent of the infection. TREATMENT   This common infection is usually only of cosmetic (only a concern to your appearance). It is easily treated with dandruff shampoo used during showers or bathing. Vigorous scrubbing will eliminate the yeast over several days time. The light areas in your skin may remain for weeks or months after the infection is cured unless your skin is exposed to sunlight. The lighter or darker spots caused by the fungus that remain after complete treatment are not a sign of treatment failure; it will take a long time to resolve. Your caregiver may recommend a number of commercial preparations or medication by mouth if home care is not working. Recurrence is common and preventative medication may be necessary. This skin condition is not highly contagious. Special care is not needed to protect close friends and family members. Normal hygiene is usually enough. Follow up is required only if you develop complications (such as  a secondary infection from scratching), if recommended by your caregiver, or if no relief is obtained from the preparations used. Document Released: 04/21/2000 Document Revised: 04/13/2011 Document Reviewed: 06/03/2008 [facility] Patient Information 2012 [place], [place].

## 2011-11-08 ENCOUNTER — Telehealth: Payer: Self-pay | Admitting: *Deleted

## 2011-11-08 DIAGNOSIS — E790 Hyperuricemia without signs of inflammatory arthritis and tophaceous disease: Secondary | ICD-10-CM

## 2011-11-08 DIAGNOSIS — E559 Vitamin D deficiency, unspecified: Secondary | ICD-10-CM

## 2011-11-08 LAB — LIPID PANEL
Cholesterol: 176 mg/dL (ref 0–200)
LDL Cholesterol: 105 mg/dL
Triglycerides: 99 mg/dL (ref 40–160)

## 2011-11-08 LAB — VITAMIN D 25 HYDROXY (VIT D DEFICIENCY, FRACTURES): Vit D, 25-Hydroxy: 30

## 2011-11-20 ENCOUNTER — Telehealth: Payer: Self-pay | Admitting: Family Medicine

## 2011-11-20 NOTE — Telephone Encounter (Signed)
Call patient: I did receive his lab work from [facility]. His cholesterol is okay. His LDL is just borderline. Normal is under 100. His levels 105. Continue work on diet exercise and recheck in one year. Also his vitamin D was borderline low. Normal is 30-100. His level was 30. I recommend starting 800 international units daily and rechecking in 3-4 months. Uric acid level was ok.

## 2011-11-21 ENCOUNTER — Encounter: Payer: Self-pay | Admitting: *Deleted

## 2011-11-21 NOTE — Telephone Encounter (Signed)
Ok, continue and recheck in 6 months.

## 2011-11-21 NOTE — Telephone Encounter (Signed)
Pt notified and states he takes 1000 iu of Vit D. He does take most every day. Please advise

## 2011-11-22 NOTE — Telephone Encounter (Signed)
Pt.notified

## 2011-11-28 ENCOUNTER — Encounter: Payer: Self-pay | Admitting: *Deleted

## 2011-11-29 ENCOUNTER — Encounter: Payer: Self-pay | Admitting: Family Medicine

## 2013-04-10 ENCOUNTER — Ambulatory Visit (INDEPENDENT_AMBULATORY_CARE_PROVIDER_SITE_OTHER): Payer: No Typology Code available for payment source

## 2013-04-10 ENCOUNTER — Ambulatory Visit (INDEPENDENT_AMBULATORY_CARE_PROVIDER_SITE_OTHER): Payer: No Typology Code available for payment source | Admitting: Sports Medicine

## 2013-04-10 ENCOUNTER — Encounter: Payer: Self-pay | Admitting: Sports Medicine

## 2013-04-10 VITALS — BP 138/95 | HR 67 | Wt 184.0 lb

## 2013-04-10 DIAGNOSIS — M25571 Pain in right ankle and joints of right foot: Secondary | ICD-10-CM

## 2013-04-10 DIAGNOSIS — M25579 Pain in unspecified ankle and joints of unspecified foot: Secondary | ICD-10-CM

## 2013-04-10 DIAGNOSIS — S8010XA Contusion of unspecified lower leg, initial encounter: Secondary | ICD-10-CM

## 2013-04-10 MED ORDER — MELOXICAM 15 MG PO TABS: ORAL_TABLET | ORAL | Status: DC

## 2013-04-10 NOTE — Assessment & Plan Note (Signed)
I think this represents a simple contusion of the posterior calf. Ankle exam is benign. X-rays, strapped with compressive dressing, Mobic. Return to see me in 2 weeks, I will likely release him afterwards.

## 2013-04-10 NOTE — Progress Notes (Signed)
Patient ID: Bryan Walls, male   DOB: [birth year], 47 y.o.   MRN: [id number]  Subjective:    CC: My garage door fell on my ankle and it hurts  HPI: This is a 47 year old man who comes in with a complaint of right ankle pain after a garage door fell on it about a week ago. The patient was slowly putting down the garage door when the 200 lbs door fell on his posterior leg. He reports that his ankle got swollen and bruised. He has used some ibuprofen, rest and  elevation for relief. He is here today because the pain still persists after a week of the injury.   Past medical history, Surgical history, Family history not pertinant except as noted below, Social history, Allergies, and medications have been entered into the medical record, reviewed, and no changes needed.   Review of Systems: No fevers, chills, night sweats, weight loss, chest pain, or shortness of breath.   Objective:    General: Well Developed, well nourished, and in no acute distress.  Neuro: Alert and oriented x3, extra-ocular muscles intact, sensation grossly intact.  HEENT: Normocephalic, atraumatic, pupils equal round reactive to light, neck Walls, no masses, no lymphadenopathy, thyroid nonpalpable.  Skin: Warm and dry, no rashes. Cardiac: Regular rate and rhythm, no murmurs rubs or gallops, no lower extremity edema.  Respiratory: Clear to auscultation bilaterally. Not using accessory muscles, speaking in full sentences. Right Ankle: No visible erythema. Mild swelling of ankle. Range of motion is full in all directions. Strength is 5/5 in all directions. Talar dome nontender; No pain at base of 5th MT; No tenderness over cuboid; No tenderness over N spot or navicular prominence Mild tenderness on posterior aspects of lateral and medial malleolus No sign of peroneal tendon subluxations or tenderness to palpation Negative tarsal tunnel tinel's Able to walk 4 steps. Negative Thompson's test  Impression and  Recommendations:   Assessment:  This is a 47 year old man who comes in with a complaint of right ankle pain after a garage door fell on it about a week ago  Right Ankle Sprain: History is consistent with mild right ankle sprain.  Patient was sent for an X-ray to rule out fracture given the nature and history of injury. -Patient was given home exercises  -We will prescribe Mobic (anti-inflammatory)  -Return in 2 weeks for follow-up. If patient is improving, we will release him at that time.

## 2013-04-24 ENCOUNTER — Ambulatory Visit: Payer: No Typology Code available for payment source | Admitting: Sports Medicine

## 2013-07-28 ENCOUNTER — Telehealth: Payer: Self-pay | Admitting: Family Medicine

## 2013-07-28 NOTE — Telephone Encounter (Signed)
Pt called and he wants referral to PT.

## 2013-07-28 NOTE — Telephone Encounter (Signed)
Called and lvm informing pt that he will need f/u appt prior to getting a referral to PT. Advised that he call our schedulers to make an appt for f/u so that he can be accessed for this.Audelia Hives [place]

## 2013-07-29 ENCOUNTER — Ambulatory Visit: Payer: No Typology Code available for payment source | Admitting: Sports Medicine

## 2013-08-01 ENCOUNTER — Encounter: Payer: Self-pay | Admitting: Family Medicine

## 2013-08-01 ENCOUNTER — Ambulatory Visit (INDEPENDENT_AMBULATORY_CARE_PROVIDER_SITE_OTHER): Payer: No Typology Code available for payment source | Admitting: Family Medicine

## 2013-08-01 VITALS — BP 138/92 | HR 73 | Wt 194.0 lb

## 2013-08-01 DIAGNOSIS — S161XXA Strain of muscle, fascia and tendon at neck level, initial encounter: Secondary | ICD-10-CM

## 2013-08-01 DIAGNOSIS — M542 Cervicalgia: Secondary | ICD-10-CM

## 2013-08-01 NOTE — Progress Notes (Signed)
   Subjective:    Patient ID: Bryan Walls, male    DOB: 24-Sep-1965, 48 y.o.   MRN: [id number]  HPI Has had chronic problems with lower neck pain. He previously lived in [place] he did physical therapy. He does have his own TENS unit. Not taking any medication right now.  Hs been flaring the last couple of months. 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  Worse while working on computer.     Review of Systems     Objective:   Physical Exam  Constitutional: He is oriented to person, place, and time. He appears well-developed and well-nourished.  Musculoskeletal:  Neck with normal flexion, extension. Decreased rotation to the right. Decreased side bending to the right. Mildly tender over the paraspinous muscles at the base of the cervical spine and upper thoracic spine. Shores with normal range of motion and strength out of 5 bilaterally in the upper 20s.  Neurological: He is alert and oriented to person, place, and time.  Skin: Skin is warm and dry.  Psychiatric: He has a normal mood and affect. His behavior is normal.          Assessment & Plan:  Cervical pain - will be happy to refer to PT.  Recommend NSAID.  Also discussed long term looking at his work settings.  He declined prescription medications today. Encouraged him to work on his exercises. He says he still has them at home from his original therapy sessions.

## 2013-08-06 ENCOUNTER — Ambulatory Visit (INDEPENDENT_AMBULATORY_CARE_PROVIDER_SITE_OTHER): Payer: No Typology Code available for payment source | Admitting: Physical Therapy

## 2013-08-06 DIAGNOSIS — S139XXA Sprain of joints and ligaments of unspecified parts of neck, initial encounter: Secondary | ICD-10-CM

## 2013-08-06 DIAGNOSIS — M542 Cervicalgia: Secondary | ICD-10-CM

## 2013-08-06 DIAGNOSIS — M2560 Stiffness of unspecified joint, not elsewhere classified: Secondary | ICD-10-CM

## 2013-08-08 ENCOUNTER — Encounter (INDEPENDENT_AMBULATORY_CARE_PROVIDER_SITE_OTHER): Payer: No Typology Code available for payment source | Admitting: Physical Therapy

## 2013-08-11 ENCOUNTER — Encounter (INDEPENDENT_AMBULATORY_CARE_PROVIDER_SITE_OTHER): Payer: No Typology Code available for payment source | Admitting: Physical Therapy

## 2013-08-11 DIAGNOSIS — M542 Cervicalgia: Secondary | ICD-10-CM

## 2013-08-11 DIAGNOSIS — M2560 Stiffness of unspecified joint, not elsewhere classified: Secondary | ICD-10-CM

## 2013-08-11 DIAGNOSIS — S139XXA Sprain of joints and ligaments of unspecified parts of neck, initial encounter: Secondary | ICD-10-CM

## 2013-08-13 ENCOUNTER — Encounter (INDEPENDENT_AMBULATORY_CARE_PROVIDER_SITE_OTHER): Payer: No Typology Code available for payment source

## 2013-08-13 DIAGNOSIS — M2560 Stiffness of unspecified joint, not elsewhere classified: Secondary | ICD-10-CM

## 2013-08-13 DIAGNOSIS — M542 Cervicalgia: Secondary | ICD-10-CM

## 2013-08-13 DIAGNOSIS — S139XXA Sprain of joints and ligaments of unspecified parts of neck, initial encounter: Secondary | ICD-10-CM

## 2013-08-22 ENCOUNTER — Encounter (INDEPENDENT_AMBULATORY_CARE_PROVIDER_SITE_OTHER): Payer: No Typology Code available for payment source | Admitting: Physical Therapy

## 2013-08-22 DIAGNOSIS — M542 Cervicalgia: Secondary | ICD-10-CM

## 2013-08-22 DIAGNOSIS — S139XXA Sprain of joints and ligaments of unspecified parts of neck, initial encounter: Secondary | ICD-10-CM

## 2013-08-22 DIAGNOSIS — M2560 Stiffness of unspecified joint, not elsewhere classified: Secondary | ICD-10-CM

## 2013-08-29 ENCOUNTER — Encounter: Payer: No Typology Code available for payment source | Admitting: Physical Therapy

## 2013-09-19 ENCOUNTER — Encounter (INDEPENDENT_AMBULATORY_CARE_PROVIDER_SITE_OTHER): Payer: No Typology Code available for payment source | Admitting: Physical Therapy

## 2013-09-19 DIAGNOSIS — M542 Cervicalgia: Secondary | ICD-10-CM

## 2013-09-19 DIAGNOSIS — M2560 Stiffness of unspecified joint, not elsewhere classified: Secondary | ICD-10-CM

## 2013-09-19 DIAGNOSIS — S139XXA Sprain of joints and ligaments of unspecified parts of neck, initial encounter: Secondary | ICD-10-CM

## 2013-09-24 ENCOUNTER — Encounter (INDEPENDENT_AMBULATORY_CARE_PROVIDER_SITE_OTHER): Payer: No Typology Code available for payment source

## 2013-09-24 DIAGNOSIS — M542 Cervicalgia: Secondary | ICD-10-CM

## 2013-09-24 DIAGNOSIS — S139XXA Sprain of joints and ligaments of unspecified parts of neck, initial encounter: Secondary | ICD-10-CM

## 2013-09-24 DIAGNOSIS — M2560 Stiffness of unspecified joint, not elsewhere classified: Secondary | ICD-10-CM

## 2013-10-02 ENCOUNTER — Encounter (INDEPENDENT_AMBULATORY_CARE_PROVIDER_SITE_OTHER): Payer: No Typology Code available for payment source | Admitting: Physical Therapy

## 2013-10-02 DIAGNOSIS — M2560 Stiffness of unspecified joint, not elsewhere classified: Secondary | ICD-10-CM

## 2013-10-02 DIAGNOSIS — M542 Cervicalgia: Secondary | ICD-10-CM

## 2013-10-02 DIAGNOSIS — S139XXA Sprain of joints and ligaments of unspecified parts of neck, initial encounter: Secondary | ICD-10-CM

## 2013-10-09 ENCOUNTER — Encounter (INDEPENDENT_AMBULATORY_CARE_PROVIDER_SITE_OTHER): Payer: No Typology Code available for payment source | Admitting: Physical Therapy

## 2013-10-09 DIAGNOSIS — M2560 Stiffness of unspecified joint, not elsewhere classified: Secondary | ICD-10-CM

## 2013-10-09 DIAGNOSIS — M542 Cervicalgia: Secondary | ICD-10-CM

## 2013-10-09 DIAGNOSIS — S139XXA Sprain of joints and ligaments of unspecified parts of neck, initial encounter: Secondary | ICD-10-CM

## 2013-10-16 ENCOUNTER — Encounter: Payer: No Typology Code available for payment source | Admitting: Physical Therapy

## 2013-10-22 ENCOUNTER — Encounter: Payer: No Typology Code available for payment source | Admitting: Physical Therapy

## 2013-10-30 ENCOUNTER — Encounter (INDEPENDENT_AMBULATORY_CARE_PROVIDER_SITE_OTHER): Payer: No Typology Code available for payment source | Admitting: Physical Therapy

## 2013-10-30 DIAGNOSIS — M542 Cervicalgia: Secondary | ICD-10-CM

## 2013-10-30 DIAGNOSIS — R262 Difficulty in walking, not elsewhere classified: Secondary | ICD-10-CM

## 2013-10-30 DIAGNOSIS — S139XXA Sprain of joints and ligaments of unspecified parts of neck, initial encounter: Secondary | ICD-10-CM

## 2015-02-12 LAB — LIPID PANEL
Cholesterol: 192 mg/dL (ref 0–200)
HDL: 46 mg/dL (ref 35–70)
LDL CALC: 121 mg/dL
Triglycerides: 124 mg/dL (ref 40–160)

## 2015-02-12 LAB — HEPATIC FUNCTION PANEL
ALT: 16 U/L (ref 10–40)
AST: 8 U/L — AB (ref 14–40)

## 2015-02-26 ENCOUNTER — Ambulatory Visit (INDEPENDENT_AMBULATORY_CARE_PROVIDER_SITE_OTHER): Payer: No Typology Code available for payment source

## 2015-02-26 ENCOUNTER — Ambulatory Visit (INDEPENDENT_AMBULATORY_CARE_PROVIDER_SITE_OTHER): Payer: No Typology Code available for payment source | Admitting: Family Medicine

## 2015-02-26 ENCOUNTER — Encounter: Payer: Self-pay | Admitting: Family Medicine

## 2015-02-26 VITALS — BP 143/127 | HR 76 | Wt 193.0 lb

## 2015-02-26 DIAGNOSIS — M659 Synovitis and tenosynovitis, unspecified: Secondary | ICD-10-CM | POA: Insufficient documentation

## 2015-02-26 DIAGNOSIS — M79645 Pain in left finger(s): Secondary | ICD-10-CM

## 2015-02-26 MED ORDER — DICLOFENAC SODIUM 2 % TD SOLN: 1.0000 g | Freq: Four times a day (QID) | TRANSDERMAL | Status: DC | PRN

## 2015-02-26 NOTE — Progress Notes (Signed)
Bryan Walls is a 49 y.o. male who presents to [place]  today for left finger injury. Patient was playing basketball and severed any injury to the left fifth MCP. He describes a dislocation which reduced himself. He notes continued pain and irritation in stiffness. He's tried some over-the-counter medicines helped a bit. He denies any fevers chills nausea vomiting or diarrhea.   Past Medical History  Diagnosis Date  . 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 Lumbar fusion     Social History  Substance Use Topics  . Smoking status: Never Smoker   . Smokeless tobacco: Not on file  . Alcohol Use: No   family history includes Cancer in his mother; Diabetes in his mother; Hypertension in his father.  ROS as above Medications: Current Outpatient Prescriptions  Medication Sig Dispense Refill  . Diclofenac Sodium (PENNSAID) 2 % SOLN Place 1 g onto the skin 4 (four) times daily as needed. 100 g 2   No current facility-administered medications for this visit.   No Known Allergies   Exam:  BP 143/127 mmHg  Pulse 76  Wt 193 lb (87.544 kg) Gen: Well NAD HEENT: EOMI,  MMM Lungs: Normal work of breathing. CTABL Heart: RRR no MRG Abd: NABS, Soft. Nondistended, Nontender Exts: Brisk capillary refill, warm and well perfused.  Left hand is well appearing. Mildly tender fifth MCP. Normal motion. Stable ligamentous exam.   No results found for this or any previous visit (from the past 24 hour(s)). Dg Hand Complete Left  02/26/2015  CLINICAL DATA:  This septated left fifth digit 2 months ago. EXAM: LEFT HAND - COMPLETE 3+ VIEW COMPARISON:  None. FINDINGS: No acute bony or joint abnormality. No evidence of fracture or dislocation. No radiopaque foreign body . IMPRESSION: No acute abnormality. Electronically Signed   By: Marcello Moores  Register   On: 02/26/2015 10:08     Please see individual  assessment and plan sections.

## 2015-02-26 NOTE — Assessment & Plan Note (Signed)
Posttraumatic synovitis of the left fifth MCP. Treat with Pennsaid, and hand PT. Recheck in a few weeks. Samples of Pennsaid provided.

## 2015-02-26 NOTE — Patient Instructions (Signed)
Thank you for coming in today. Apply Pennsaid 4x daily for pain.,  Attend hand therapy in [place].  [facility]  [facility] [place] [id number]  [phone] Take up to 2 aleve twice daily for pain as needed.    Return in 4 weeks if not better.

## 2015-03-03 ENCOUNTER — Other Ambulatory Visit: Payer: Self-pay

## 2015-03-03 MED ORDER — DICLOFENAC SODIUM 2 % TD SOLN: 1.0000 g | Freq: Four times a day (QID) | TRANSDERMAL | Status: DC | PRN

## 2015-03-04 ENCOUNTER — Telehealth: Payer: Self-pay | Admitting: Family Medicine

## 2015-03-04 MED ORDER — DICLOFENAC SODIUM 1 % TD GEL: 2.0000 g | Freq: Four times a day (QID) | TRANSDERMAL | Status: DC

## 2015-03-04 MED ORDER — DICLOFENAC SODIUM 3 % TD GEL: 2.0000 g | Freq: Four times a day (QID) | TRANSDERMAL | Status: DC

## 2015-03-04 NOTE — Telephone Encounter (Signed)
Pt is requesting 3% instead of 1%. Please advise.

## 2015-03-04 NOTE — Telephone Encounter (Signed)
Will switch to diclofenac gel 1%. Medications sent into [facility] [address] in [place].

## 2015-03-04 NOTE — Telephone Encounter (Signed)
Pt.notified

## 2015-03-04 NOTE — Telephone Encounter (Signed)
Will switch to 3% gel

## 2015-03-17 ENCOUNTER — Telehealth: Payer: Self-pay | Admitting: Emergency Medicine

## 2015-06-15 ENCOUNTER — Ambulatory Visit (INDEPENDENT_AMBULATORY_CARE_PROVIDER_SITE_OTHER): Payer: 59 | Admitting: Family Medicine

## 2015-06-15 ENCOUNTER — Ambulatory Visit (INDEPENDENT_AMBULATORY_CARE_PROVIDER_SITE_OTHER): Payer: 59

## 2015-06-15 ENCOUNTER — Encounter: Payer: Self-pay | Admitting: Family Medicine

## 2015-06-15 VITALS — BP 148/109 | HR 82 | Wt 193.0 lb

## 2015-06-15 DIAGNOSIS — M659 Synovitis and tenosynovitis, unspecified: Secondary | ICD-10-CM | POA: Insufficient documentation

## 2015-06-15 DIAGNOSIS — M25542 Pain in joints of left hand: Secondary | ICD-10-CM

## 2015-06-15 DIAGNOSIS — S6992XA Unspecified injury of left wrist, hand and finger(s), initial encounter: Secondary | ICD-10-CM

## 2015-06-15 DIAGNOSIS — S62609A Fracture of unspecified phalanx of unspecified finger, initial encounter for closed fracture: Secondary | ICD-10-CM

## 2015-06-15 NOTE — Assessment & Plan Note (Addendum)
Fracture. Buddy tape applied. Recheck in 3 weeks.  follow-up with PCP regarding blood pressure.

## 2015-06-15 NOTE — Patient Instructions (Signed)
Thank you for coming in today. Return in 2-3 weeks.  Follow up with your doctor about your blood pressure.   Buddy Taping You have a minor finger or toe injury. It can be managed by buddy taping. Buddy taping means the injured finger or toe is taped to a healthy uninjured adjacent finger or toe. Most minor fractures and dislocations of the smaller fingers and toes will heal in 3 to 4 weeks. Buddy taping immobilizes and protects the area of injury. Buddy taping is not recommended for initial treatment of fractures of the thumb, longer fingers, or the great toe. Buddy taping should not be used for unstable or deformed fractures, but as fracture healing progresses it may be used for protection during rehabilitation. Fractured fingers and toes should be protected by buddy taping as long as the injury is still painful or swollen.  When an injury is buddy taped, place a small piece of gauze or cotton between the digits that are taped. This helps prevent the skin from breaking down from increased moisture. Buddy taping allows you to get your injury wet when you bathe. Change the gauze and tape more often if it gets wet, and dry the space between the finger or toes. Use a sturdy, hard-soled shoe for better support if you have a fractured toe. In 2 to 3 weeks you can start motion exercises. This will keep the fingers or toes from becoming stiff.  SEEK IMMEDIATE MEDICAL CARE IF:   The injured area becomes cold, numb, or pale.  You have pain not controlled with medications.  You notice increasing deformity of the toe or finger.   This information is not intended to replace advice given to you by your health care provider. Make sure you discuss any questions you have with your health care provider.   Document Released: 06/01/2004 Document Revised: 05/15/2014 Document Reviewed: 09/16/2014 Elsevier Interactive Patient Education [employer].

## 2015-06-15 NOTE — Progress Notes (Signed)
   Bryan Walls is a 50 y.o. male who presents to [place] today for anger injury. Patient jammed his left fourth digit PIP playing basketball 3 weeks ago. It is persistently swollen and painful. He is unable to fully extend his finger. He denies any radiating pain weakness or numbness. He has tried some topical diclofenac gel which has helped only a little.   Past Medical History  Diagnosis Date  . 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 Lumbar fusion     Social History  Substance Use Topics  . Smoking status: Never Smoker   . Smokeless tobacco: Not on file  . Alcohol Use: No   family history includes Cancer in his mother; Diabetes in his mother; Hypertension in his father.  ROS:  No headache, visual changes, nausea, vomiting, diarrhea, constipation, dizziness, abdominal pain, skin rash, fevers, chills, night sweats, weight loss, swollen lymph nodes, body aches, joint swelling, muscle aches, chest pain, shortness of breath, mood changes, visual or auditory hallucinations.    Medications: Current Outpatient Prescriptions  Medication Sig Dispense Refill  . Diclofenac Sodium 3 % GEL Place 2 g onto the skin QID. 100 g 3   No current facility-administered medications for this visit.   No Known Allergies   Exam:  BP 148/109 mmHg  Pulse 82  Wt 193 lb (87.544 kg) General: Well Developed, well nourished, and in no acute distress.  Neuro/Psych: Alert and oriented x3, extra-ocular muscles intact, able to move all 4 extremities, sensation grossly intact. Skin: Warm and dry, no rashes noted.  Respiratory: Not using accessory muscles, speaking in full sentences, trachea midline.  Cardiovascular: Pulses palpable, no extremity edema. Abdomen: Does not appear distended. MSK: Left hand normal-appearing with the exception of swelling and tenderness about the left fourth digit PIP.  Patient lacks full extension by about 15 of the PIP that has normal intact flexion and extension strength throughout the MCP PIP and DIP. No obvious swan-neck or boutonniere deformity present.  Xray finger: Subtle fracture with callus formation proximal middle phalanx. Non-displaced.  Awaiting formal read.    No results found for this or any previous visit (from the past 24 hour(s)).    Please see individual assessment and plan sections.

## 2015-07-13 ENCOUNTER — Encounter: Payer: Self-pay | Admitting: Family Medicine

## 2015-07-13 ENCOUNTER — Ambulatory Visit (INDEPENDENT_AMBULATORY_CARE_PROVIDER_SITE_OTHER): Payer: 59 | Admitting: Family Medicine

## 2015-07-13 VITALS — BP 153/92 | HR 93 | Wt 195.0 lb

## 2015-07-13 DIAGNOSIS — M659 Synovitis and tenosynovitis, unspecified: Secondary | ICD-10-CM | POA: Diagnosis not present

## 2015-07-13 NOTE — Progress Notes (Signed)
       Bryan Walls is a 50 y.o. male who presents to [place]: [facility] today for left fourth digit PIP synovitis. Patient originally suffered an injury to his left fourth digit PIP joint and early October 2016.  He was treated with diclofenac gel which helps some. He had a repeat injury in late January. Since then he has had continued pain and swelling. Discontinue diclofenac gel. He notes difficulty extending the fourth digit fully and difficulty flexing it due to pain. He notes pain pain. I pain. He is quite frustrated. He has not had hand physical therapy for the fourth digit yet.   Past Medical History  Diagnosis Date  . 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 Lumbar fusion     Social History  Substance Use Topics  . Smoking status: Never Smoker   . Smokeless tobacco: Not on file  . Alcohol Use: No   family history includes Cancer in his mother; Diabetes in his mother; Hypertension in his father.  ROS as above Medications: Current Outpatient Prescriptions  Medication Sig Dispense Refill  . Diclofenac Sodium 3 % GEL Place 2 g onto the skin QID. 100 g 3   No current facility-administered medications for this visit.   No Known Allergies   Exam:  BP 153/92 mmHg  Pulse 93  Wt 195 lb (88.451 kg) Gen: Well NAD Left hand swollen and tender fourth digit PIP with inability to extend the digit beyond about 15. Flexion slightly impaired as well. Pulses capillary refill and sensation intact. Strength is intact.   No results found for this or any previous visit (from the past 24 hour(s)). No results found.   Please see individual assessment and plan sections.

## 2015-07-13 NOTE — Patient Instructions (Signed)
Thank you for coming in today. Follow up with the hand surgeons.  Return following evaluation.  We can do injections.  I am referring to hand therapy.

## 2015-07-13 NOTE — Assessment & Plan Note (Signed)
X-ray was negative for fracture originally. Suspect simple synovitis. Plan for hand therapy and hand surgery referral for second opinion. Continue diclofenac gel. Return following hand therapy visit.

## 2015-07-30 ENCOUNTER — Telehealth: Payer: Self-pay

## 2015-07-30 NOTE — Telephone Encounter (Signed)
Pt called for clarity on the referrals you discussed for his hand. I was under the impression that he would be referred to two different locations. Please advise.

## 2015-08-02 NOTE — Telephone Encounter (Signed)
Faxed pt order to physical therapy and hand specialist. Left detailed vm informing pt.

## 2015-08-02 NOTE — Telephone Encounter (Signed)
We should have referred to Dr Amedeo Plenty as first choice and Dr Fredna Dow as 2nd choice.

## 2015-09-07 LAB — BASIC METABOLIC PANEL
CREATININE: 1.4 mg/dL — AB (ref 0.6–1.3)
GLUCOSE: 100 mg/dL
Potassium: 3.6 mmol/L (ref 3.4–5.3)
Sodium: 142 mmol/L (ref 137–147)

## 2015-09-07 LAB — CBC AND DIFFERENTIAL
HEMOGLOBIN: 16.1 g/dL (ref 13.5–17.5)
PLATELETS: 243 10*3/uL (ref 150–399)
WBC: 5.9 10*3/mL

## 2015-11-12 ENCOUNTER — Ambulatory Visit: Payer: 59 | Admitting: Family Medicine

## 2015-11-19 LAB — HEPATIC FUNCTION PANEL
ALT: 11 U/L (ref 10–40)
AST: 11 U/L — AB (ref 14–40)
Alkaline Phosphatase: 46 U/L (ref 25–125)
Bilirubin, Total: 0.5 mg/dL

## 2015-11-19 LAB — CBC AND DIFFERENTIAL
HCT: 46 % (ref 41–53)
HEMOGLOBIN: 15.5 g/dL (ref 13.5–17.5)
Platelets: 218 10*3/uL (ref 150–399)
WBC: 5.6 10^3/mL

## 2015-11-19 LAB — BASIC METABOLIC PANEL
BUN: 15 mg/dL (ref 4–21)
Creatinine: 1.5 mg/dL — AB (ref 0.6–1.3)
GLUCOSE: 92 mg/dL
POTASSIUM: 4.2 mmol/L (ref 3.4–5.3)
SODIUM: 141 mmol/L (ref 137–147)

## 2015-12-16 ENCOUNTER — Ambulatory Visit (INDEPENDENT_AMBULATORY_CARE_PROVIDER_SITE_OTHER): Payer: 59 | Admitting: Family Medicine

## 2015-12-16 ENCOUNTER — Encounter: Payer: Self-pay | Admitting: Family Medicine

## 2015-12-16 VITALS — BP 134/93 | HR 74 | Resp 15 | Ht 72.0 in | Wt 195.0 lb

## 2015-12-16 DIAGNOSIS — E559 Vitamin D deficiency, unspecified: Secondary | ICD-10-CM | POA: Diagnosis not present

## 2015-12-16 DIAGNOSIS — Z0000 Encounter for general adult medical examination without abnormal findings: Secondary | ICD-10-CM | POA: Diagnosis not present

## 2015-12-16 DIAGNOSIS — E042 Nontoxic multinodular goiter: Secondary | ICD-10-CM | POA: Diagnosis not present

## 2015-12-16 NOTE — Progress Notes (Signed)
   Subjective:    Patient ID: Bryan Walls, male    DOB: 1966-02-23, 50 y.o.   MRN: [id number]  HPI Here today for complete physical exam. He also received some care through the [place]. He did want to update me about a couple of things. He started having more severe abdominal pain this past year and actually made an appointment with GI at [facility]. He saw Dr. Truman Hayward. They did an endoscopy and was diagnosed with a gastric ulcer. They felt like it was due to chronic use of NSAIDs from an old injury from the [employer]. He actually had abdominal pain on and off since probably 2009. He is now taking members all 40 mg once a day.   He was also recently diagnosed with thyroid nodules and had a biopsy last year 2016 which she reports was negative. He has normal function of his thyroid gland.   He was also diagnosed with vitamin D deficiency and has had it in the past. He is now currently taking 1000 international units daily over-the-counter.   Review of Systems     Objective:   Physical Exam  Constitutional: He is oriented to person, place, and time. He appears well-developed and well-nourished.  HENT:  Head: Normocephalic and atraumatic.  Right Ear: External ear normal.  Left Ear: External ear normal.  Nose: Nose normal.  Mouth/Throat: Oropharynx is clear and moist.  Eyes: Conjunctivae and EOM are normal. Pupils are equal, round, and reactive to light.  Neck: Normal range of motion. Neck supple. Thyromegaly present.  Cardiovascular: Normal rate, regular rhythm, normal heart sounds and intact distal pulses.   Pulmonary/Chest: Effort normal and breath sounds normal.  Abdominal: Soft. Bowel sounds are normal. He exhibits no distension and no mass. There is no tenderness. There is no rebound and no guarding.  Musculoskeletal: Normal range of motion. He exhibits no edema.  Lymphadenopathy:    He has no cervical adenopathy.  Neurological: He is alert and oriented to person, place, and time. He has  normal reflexes.  Skin: Skin is warm and dry.  Psychiatric: He has a normal mood and affect. His behavior is normal. Judgment and thought content normal.          Assessment & Plan:  CPE Keep up a regular exercise program and make sure you are eating a healthy diet Try to eat 4 servings of dairy a day, or if you are lactose intolerant take a calcium with vitamin D daily.  Your vaccines are up to date.   Vitamin D deficiency-on supplementation. We'll recheck levels in September with his PCP at the [place].  Thyroid nodules-benign. Added to problem list.

## 2015-12-16 NOTE — Patient Instructions (Signed)
Keep up a regular exercise program and make sure you are eating a healthy diet Try to eat 4 servings of dairy a day, or if you are lactose intolerant take a calcium with vitamin D daily.    

## 2015-12-17 ENCOUNTER — Encounter: Payer: Self-pay | Admitting: Family Medicine

## 2015-12-24 ENCOUNTER — Encounter: Payer: Self-pay | Admitting: Family Medicine

## 2015-12-28 ENCOUNTER — Encounter: Payer: Self-pay | Admitting: *Deleted

## 2015-12-28 LAB — CALCIUM: Calcium: 9.3 mg/dL

## 2015-12-31 ENCOUNTER — Encounter: Payer: Self-pay | Admitting: *Deleted

## 2015-12-31 LAB — VITAMIN D 25 HYDROXY (VIT D DEFICIENCY, FRACTURES): VIT D 25 HYDROXY: 17.61

## 2016-02-14 ENCOUNTER — Encounter: Payer: Self-pay | Admitting: Podiatry

## 2016-02-14 ENCOUNTER — Ambulatory Visit (INDEPENDENT_AMBULATORY_CARE_PROVIDER_SITE_OTHER): Payer: 59 | Admitting: Podiatry

## 2016-02-14 VITALS — BP 144/98 | HR 65 | Ht 71.0 in | Wt 189.0 lb

## 2016-02-14 DIAGNOSIS — M21619 Bunion of unspecified foot: Secondary | ICD-10-CM | POA: Diagnosis not present

## 2016-02-14 DIAGNOSIS — M2041 Other hammer toe(s) (acquired), right foot: Secondary | ICD-10-CM

## 2016-02-14 DIAGNOSIS — S99929A Unspecified injury of unspecified foot, initial encounter: Secondary | ICD-10-CM

## 2016-02-14 DIAGNOSIS — M216X9 Other acquired deformities of unspecified foot: Secondary | ICD-10-CM | POA: Diagnosis not present

## 2016-02-14 DIAGNOSIS — M2042 Other hammer toe(s) (acquired), left foot: Secondary | ICD-10-CM | POA: Diagnosis not present

## 2016-02-14 NOTE — Progress Notes (Signed)
SUBJECTIVE: 50 y.o. year old male presents with problematic toe nail on right great toe and left 4th digit. He works at [employer]. Patient is wearing soft cloth shoes.  Patient relates history of injury to right great toe.  Problem #1: A weight fell down on top of right great toe several months ago (5-60months). Got blood pool under the nail. Been picking on it and got old blood out of nail bed. Now he is noticing the nail is not growing.   Problem #2: 4th toe nail on left foot grows out and then splits at the tip. He has to pull it out.    REVIEW OF SYSTEMS: A comprehensive review of systems was negative except for: Mild stomach ulcer that is under control with oral mediation. Had back injury while in [employer] in 1989. Got back surgery in 2006.  OBJECTIVE: DERMATOLOGIC EXAMINATION: Deformed right great toe nail with loss of center portion leaving a void crate with exposed nail bed that is hard and dry. Surrounding nail plate is stained with blood. Proximal nail bed has new nail formation 2 mm growth from proximal skin folder.  Left 4th toe nail has hard keratotic tissue build up at distal end with normal nail growth. No other abnormal changes.   VASCULAR EXAMINATION OF LOWER LIMBS: All pedal pulses are palpable with normal pulsation.  Temperature gradient from tibial crest to dorsum of foot is within normal bilateral. No edema or erythema noted.  NEUROLOGIC EXAMINATION OF THE LOWER LIMBS: All epicritic and tactile sensations grossly intact.  MUSCULOSKELETAL EXAMINATION: Positive for Hallux valgus with bunion on left, contracted lesser digits bilateral. Positive for excess sagittal plane motion of the first ray bilateral L>R. STJ hyperpronation upon loading of foot.  ASSESSMENT: Nail injury right great toe with positive sign of new nail growth at the proximal base.  STJ hyperpronation with unstable first ray. 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 Nail problem 4th digit  left due to faulty biomechanics, flexion substitution.  PLAN: Reviewed clinical findings, biomechanics of unstable first ray, and available treatment options, proper shoe gear, orthotics. Vick's vapor rub to soften right great toe nail plate to promote normal nail growth. Return for custom orthotics.

## 2016-02-14 NOTE — Patient Instructions (Signed)
Seen for problematic toe nail right great toe nail and 4th digit left. Reviewed findings. May benefit from custom orthotics.

## 2016-05-08 DIAGNOSIS — Z9889 Other specified postprocedural states: Secondary | ICD-10-CM | POA: Insufficient documentation

## 2017-01-10 ENCOUNTER — Encounter: Payer: Self-pay | Admitting: Family Medicine

## 2017-01-10 ENCOUNTER — Ambulatory Visit (INDEPENDENT_AMBULATORY_CARE_PROVIDER_SITE_OTHER): Payer: 59 | Admitting: Family Medicine

## 2017-01-10 VITALS — BP 155/110 | HR 79 | Wt 201.0 lb

## 2017-01-10 DIAGNOSIS — S161XXA Strain of muscle, fascia and tendon at neck level, initial encounter: Secondary | ICD-10-CM

## 2017-01-10 DIAGNOSIS — S39012A Strain of muscle, fascia and tendon of lower back, initial encounter: Secondary | ICD-10-CM | POA: Diagnosis not present

## 2017-01-10 DIAGNOSIS — S060X0A Concussion without loss of consciousness, initial encounter: Secondary | ICD-10-CM

## 2017-01-10 NOTE — Patient Instructions (Signed)
Thank you for coming in today. Attend PT.  Cognitive rest.  Recheck in 2 weeks.    Concussion, Adult A concussion is a brain injury from a direct hit (blow) to the head or body. This blow causes the brain to shake quickly back and forth inside the skull. This can damage brain cells and cause chemical changes in the brain. A concussion may also be known as a mild traumatic brain injury (TBI). Concussions are usually not life-threatening, but the effects of a concussion can be serious. If you have a concussion, you are more likely to experience concussion-like symptoms after a direct blow to the head in the future. What are the causes? This condition is caused by:  A direct blow to the head, such as from running into another player during a game, being hit in a fight, or hitting your head on a hard surface.  A jolt of the head or neck that causes the brain to move back and forth inside the skull, such as in a car crash.  What are the signs or symptoms? The signs of a concussion can be hard to notice. Early on, they may be missed by you, family members, and health care providers. You may look fine but act or feel differently. Symptoms are usually temporary, but they may last for days, weeks, or even longer. Some symptoms may appear right away but other symptoms may not show up for hours or days. Every head injury is different. Symptoms may include:  Headaches. This can include a feeling of pressure in the head.  Memory problems.  Trouble concentrating, organizing, or making decisions.  Slowness in thinking, acting or reacting, speaking, or reading.  Confusion.  Fatigue.  Changes in eating or sleeping patterns.  Problems with coordination or balance.  Nausea or vomiting.  Numbness or tingling.  Sensitivity to light or noise.  Vision or hearing problems.  Reduced sense of smell.  Irritability or mood changes.  Dizziness.  Lack of motivation.  Seeing or hearing things  that other people do not see or hear (hallucinations).  How is this diagnosed? This condition is diagnosed based on:  Your symptoms.  A description of your injury.  You may also have tests, including:  Imaging tests, such as a CT scan or MRI. These are done to look for signs of brain injury.  Neuropsychological tests. These measure your thinking, understanding, learning, and remembering abilities.  How is this treated? This condition is treated with physical and mental rest and careful observation, usually at home. If the concussion is severe, you may need to stay home from work for a while. You may be referred to a concussion clinic or to other health care providers for management. It is important that you tell your health care provider if:  You are taking any medicines, including prescription medicines, over-the-counter medicines, and natural remedies. Some medicines, such as blood thinners (anticoagulants) and aspirin, may increase the chance of complications, such as bleeding.  You are taking or have taken alcohol or illegal drugs. Alcohol and certain other drugs may slow your recovery and can put you at risk of further injury.  How fast you will recover from a concussion depends on many factors, such as how severe your concussion is, what part of your brain was injured, how old you are, and how healthy you were before the concussion. Recovery can take time. It is important to wait to return to activity until a health care provider says it is safe  to do that and your symptoms are completely gone. Follow these instructions at home: Activity  Limit activities that require a lot of thought or concentration. These may include: ? Doing homework or job-related work. ? Watching TV. ? Working on the computer. ? Playing memory games and puzzles.  Rest. Rest helps the brain to heal. Make sure you: ? Get plenty of sleep at night. Avoid staying up late at night. ? Keep the same bedtime  hours on weekends and weekdays. ? Rest during the day. Take naps or rest breaks when you feel tired.  Having another concussion before the first one has healed can be dangerous. Do not do high-risk activities that could cause a second concussion, such as riding a bicycle or playing sports.  Ask your health care provider when you can return to your normal activities, such as school, work, athletics, driving, riding a bicycle, or using heavy machinery. Your ability to react may be slower after a brain injury. Never do these activities if you are dizzy. Your health care provider will likely give you a plan for gradually returning to activities. General instructions  Take over-the-counter and prescription medicines only as told by your health care provider.  Do not drink alcohol until your health care provider says you can.  If it is harder than usual to remember things, write them down.  If you are easily distracted, try to do one thing at a time. For example, do not try to watch TV while fixing dinner.  Talk with family members or close friends when making important decisions.  Watch your symptoms and tell others to do the same. Complications sometimes occur after a concussion. Older adults with a brain injury may have a higher risk of serious complications, such as a blood clot in the brain.  Tell your teachers, school nurse, school counselor, coach, athletic trainer, or work Freight forwarder about your injury, symptoms, and restrictions. Tell them about what you can or cannot do. They should watch for: ? Increased problems with attention or concentration. ? Increased difficulty remembering or learning new information. ? Increased time needed to complete tasks or assignments. ? Increased irritability or decreased ability to cope with stress. ? Increased symptoms.  Keep all follow-up visits as told by your health care provider. This is important. How is this prevented? It is very important to avoid  another brain injury, especially as you recover. In rare cases, another injury can lead to permanent brain damage, brain swelling, or death. The risk of this is greatest during the first 7-10 days after a head injury. Avoid injuries by:  Wearing a seat belt when riding in a car.  Wearing a helmet when biking, skiing, skateboarding, skating, or doing similar activities.  Avoiding activities that could lead to a second concussion, such as contact or recreational sports, until your health care provider says it is okay.  Taking safety measures in your home, such as: ? Removing clutter and tripping hazards from floors and stairways. ? Using grab bars in bathrooms and handrails by stairs. ? Placing non-slip mats on floors and in bathtubs. ? Improving lighting in dim areas.  Contact a health care provider if:  Your symptoms get worse.  You have new symptoms.  You continue to have symptoms for more than 2 weeks. Get help right away if:  You have severe or worsening headaches.  You have weakness or numbness in any part of your body.  Your coordination gets worse.  You vomit repeatedly.  You  are sleepier.  The pupil of one eye is larger than the other.  You have convulsions or a seizure.  Your speech is slurred.  Your fatigue, confusion, or irritability gets worse.  You cannot recognize people or places.  You have neck pain.  It is difficult to wake you up.  You have unusual behavior changes.  You lose consciousness. Summary  A concussion is a brain injury from a direct hit (blow) to the head or body.  A concussion may also be called a mild traumatic brain injury (TBI).  You may have imaging tests and neuropsychological tests to diagnose a concussion.  This condition is treated with physical and mental rest and careful observation.  Ask your health care provider when you can return to your normal activities, such as school, work, athletics, driving, riding a bicycle,  or using heavy machinery. Follow safety instructions as told by your health care provider. This information is not intended to replace advice given to you by your health care provider. Make sure you discuss any questions you have with your health care provider. Document Released: 07/15/2003 Document Revised: 04/04/2016 Document Reviewed: 04/04/2016 Elsevier Interactive Patient Education  2017 [employer].
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
## 2017-01-16 ENCOUNTER — Ambulatory Visit (INDEPENDENT_AMBULATORY_CARE_PROVIDER_SITE_OTHER): Payer: PRIVATE HEALTH INSURANCE | Admitting: Rehabilitative and Restorative Service Providers"

## 2017-01-16 ENCOUNTER — Encounter: Payer: Self-pay | Admitting: Rehabilitative and Restorative Service Providers"

## 2017-01-16 DIAGNOSIS — M542 Cervicalgia: Secondary | ICD-10-CM | POA: Diagnosis not present

## 2017-01-16 DIAGNOSIS — M545 Low back pain: Secondary | ICD-10-CM | POA: Diagnosis not present

## 2017-01-16 DIAGNOSIS — R29898 Other symptoms and signs involving the musculoskeletal system: Secondary | ICD-10-CM | POA: Diagnosis not present

## 2017-01-16 NOTE — Patient Instructions (Addendum)
Walking 5-7 min three times a day level surfaces Increase by 1-2 min every 1-2 days  Working toward 30 min of walking 2-3 times a day   Axial Extension (Chin Tuck)    Pull chin in and lengthen back of neck. Hold __10__ seconds while counting out loud. Repeat __5__ times. Do ___4-5 _ sessions per day.   Side Bend, Sitting    Sit, head in comfortable, centered position, chin slightly tucked. Gently tilt head, bringing ear toward same-side shoulder. Hold _3-5__ seconds.  Repeat __5_ times per session. Do _3-4__ sessions per day.   Scapula Adduction With Pectoralis Stretch: Mid-Range - Standing   Shoulders at 90 elbows even with shoulders, keeping weight through legs, shift weight forward until you feel pull or strength through the front of your chest. Hold __30_ seconds. Do _3__ times, __2-4_ times per day.   Bent Leg Lift (Hook-Lying)    Tighten stomach and slowly raise right leg ____ inches from floor. Keep trunk rigid. Hold __2__ seconds. Repeat _10___ times per set. Do __2-3__ sets per session. Do ___2_ sessions per day.    Use TENS unit several times a day
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
## 2017-01-19 ENCOUNTER — Ambulatory Visit (INDEPENDENT_AMBULATORY_CARE_PROVIDER_SITE_OTHER): Payer: PRIVATE HEALTH INSURANCE | Admitting: Physical Therapy

## 2017-01-19 ENCOUNTER — Encounter: Payer: Self-pay | Admitting: Physical Therapy

## 2017-01-19 DIAGNOSIS — M542 Cervicalgia: Secondary | ICD-10-CM

## 2017-01-19 DIAGNOSIS — R29898 Other symptoms and signs involving the musculoskeletal system: Secondary | ICD-10-CM | POA: Diagnosis not present

## 2017-01-19 DIAGNOSIS — M545 Low back pain: Secondary | ICD-10-CM | POA: Diagnosis not present
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
## 2017-01-19 NOTE — Patient Instructions (Signed)

## 2017-01-22 ENCOUNTER — Ambulatory Visit (INDEPENDENT_AMBULATORY_CARE_PROVIDER_SITE_OTHER): Payer: PRIVATE HEALTH INSURANCE | Admitting: Physical Therapy

## 2017-01-22 DIAGNOSIS — M542 Cervicalgia: Secondary | ICD-10-CM

## 2017-01-22 DIAGNOSIS — R29898 Other symptoms and signs involving the musculoskeletal system: Secondary | ICD-10-CM

## 2017-01-22 DIAGNOSIS — M545 Low back pain: Secondary | ICD-10-CM

## 2017-01-22 NOTE — Therapy (Signed)
[place], [place], [address] Phone: [phone]   Fax:  [phone]  Physical Therapy Treatment  Patient Details  Name: Bryan Walls MRN: [id number] Date of Birth: 14-Jun-1965 Referring Provider: Dr. Georgina Snell   Encounter Date: 01/22/2017      PT End of Session - 01/22/17 1153    Visit Number 3   Number of Visits 12   Date for PT Re-Evaluation 02/27/17   PT Start Time 4259   PT Stop Time 5638   PT Time Calculation (min) 56 min   Activity Tolerance Patient tolerated treatment well;No increased pain   Behavior During Therapy WFL for tasks assessed/performed      Past Medical History:  Diagnosis Date  . Syndrome X, cardiac [facility])    history    Past Surgical History:  Procedure Laterality Date  . LUMBAR FUSION      There were no vitals filed for this visit.      Subjective Assessment - 01/22/17 1150    Subjective Pt reports he took it easy over weekend.  "I just can't shake this lumbar area (pain)".      Currently in Pain? Yes   Pain Score 2    Pain Location Neck   Pain Orientation Lower   Pain Descriptors / Indicators Aching   Aggravating Factors  sleeping, sitting too long   Pain Relieving Factors ?   Pain Score 7   Pain Location Back   Pain Orientation Right;Left;Lower   Aggravating Factors  bending             [facility] PT Assessment - 01/22/17 0001      Assessment   Medical Diagnosis Cervical pain; LBP   Referring Provider Dr. Georgina Snell    Onset Date/Surgical Date 01/05/17   Hand Dominance Right   Next MD Visit 01/24/17     AROM   Cervical - Right Side Bend 25   Cervical - Left Side Bend 47   Cervical - Right Rotation 55   Cervical - Left Rotation 62          [facility] Adult PT Treatment/Exercise - 01/22/17 0001      Neck Exercises: Standing   Other Standing Exercises mid position pec stretch 30 sec x 3      Neck Exercises: Supine   Other Supine Exercise 10 reps,  green band, overhead pull, bilat ER, and SASH each side.      Lumbar Exercises: Stretches   Passive Hamstring Stretch 2 reps;30 seconds   Lower Trunk Rotation 5 reps;10 seconds   Piriformis Stretch 2 reps;30 seconds     Lumbar Exercises: Aerobic   Stationary Bike NuStep: (arm/leg) L5: 6.5 min      Lumbar Exercises: Seated   Sit to Stand 10 reps  core engaged; slow and controlled     Lumbar Exercises: Supine   Bent Knee Raise 15 reps  with ab set   Bent Knee Raise Limitations then with opp arm lift above head x 10 reps      Moist Heat Therapy   Number Minutes Moist Heat 15 Minutes   Moist Heat Location Cervical;Lumbar Spine     Electrical Stimulation   Electrical Stimulation Location cervical and lumbar   Electrical Stimulation Action premod to each area   Electrical Stimulation Parameters to tolerance    Electrical Stimulation Goals Pain                PT Education - 01/22/17 1211  Education provided Yes   Education Details HEP   Person(s) Educated Patient   Methods Explanation;Handout;Verbal cues;Tactile cues   Comprehension Verbalized understanding;Returned demonstration             PT Long Term Goals - 01/22/17 1216      PT LONG TERM GOAL #1   Title Improve posture and alignment with patient to demonstrate improved uproght posture 02/27/17   Time 6   Period Weeks   Status On-going     PT LONG TERM GOAL #2   Title Improve cervical and lumbar ROM to WFL's allowing patient to perform ADL's with minimal limitations 02/27/17   Time 6   Period Weeks   Status On-going     PT LONG TERM GOAL #3   Title Decrease frequency; intensity and duration of pain by 25-50% 02/27/17   Time 6   Period Weeks   Status On-going  improvement in neck     PT LONG TERM GOAL #4   Title Independent in HEP 02/27/17   Time 6   Period Weeks   Status On-going     PT LONG TERM GOAL #5   Title Improve FOTO to </= 49% limitation 02/27/17   Time 6   Period Weeks    Status On-going               Plan - 01/22/17 1215    Clinical Impression Statement Pt demonstrated improved cervical rotation ROM.  He tolerated all exercises well, without increase in pain.  Pt progressing towards established goals.    Rehab Potential Fair   PT Frequency 2x / week   PT Duration 6 weeks   PT Treatment/Interventions Patient/family education;ADLs/Self Care Home Management;Cryotherapy;Electrical Stimulation;Iontophoresis 4mg /ml Dexamethasone;Moist Heat;Ultrasound;Dry needling;Manual techniques;Therapeutic activities;Therapeutic exercise;Neuromuscular re-education   PT Next Visit Plan continue to encourage whole body movement and safe movement patterns.  modalities PRN   Consulted and Agree with Plan of Care Patient      Patient will benefit from skilled therapeutic intervention in order to improve the following deficits and impairments:  Postural dysfunction, Improper body mechanics, Pain, Decreased mobility, Decreased activity tolerance  Visit Diagnosis: Cervicalgia  Other symptoms and signs involving the musculoskeletal system  Acute left-sided low back pain, with sciatica presence unspecified     Problem List Patient Active Problem List   Diagnosis Date Noted  . Multiple thyroid nodules 12/16/2015  . Synovitis of finger 06/15/2015  . Vitamin D deficiency 01/28/2010  . LOW BACK PAIN, CHRONIC 08/17/2009  . UNSPECIFIED ADJUSTMENT REACTION 02/05/2008   Kerin Perna, PTA 01/22/17 12:32 PM  [place], [place], [address] Phone: [phone]   Fax:  [phone]  Name: Bryan Walls MRN: [id number] Date of Birth: 06/23/1965

## 2017-01-22 NOTE — Patient Instructions (Signed)
HIP: Hamstrings - Supine    Place strap around foot. Raise leg up, keep knee straight. Hold _30__ seconds. _3__ reps per set, __2_ sets per day, __7_ days per week (keep other knee bent)   Abdominal Bracing With Pelvic Floor (Hook-Lying)   With neutral spine, tighten pelvic floor and abdominals. Hold 10 seconds. Repeat __10_ times. Do _1__ times a day.   Knee to Chest: Transverse Plane Stability   Bring one knee up, then return. Be sure pelvis does not roll side to side. Keep pelvis still. Lift knee __10_ times each leg. Restabilize pelvis. Repeat with other leg. Do _1-2__ sets, _1__ times per day.  Can add opposite arm and leg.    Hip External Rotation With Pillow: Transverse Plane Stability   One knee bent, one leg straight, on pillow. Slowly roll bent knee out. Be sure pelvis does not rotate. Do _10__ times. Restabilize pelvis. Repeat with other leg. Do _1-2__ sets, _1__ times per day.   [facility] at [facility] [place], [place] [address]  [phone] (office) [phone] (fax)

## 2017-01-24 ENCOUNTER — Encounter: Payer: Self-pay | Admitting: Family Medicine

## 2017-01-24 ENCOUNTER — Ambulatory Visit (INDEPENDENT_AMBULATORY_CARE_PROVIDER_SITE_OTHER): Payer: PRIVATE HEALTH INSURANCE | Admitting: Physical Therapy

## 2017-01-24 ENCOUNTER — Ambulatory Visit (INDEPENDENT_AMBULATORY_CARE_PROVIDER_SITE_OTHER): Payer: 59

## 2017-01-24 ENCOUNTER — Ambulatory Visit (INDEPENDENT_AMBULATORY_CARE_PROVIDER_SITE_OTHER): Payer: 59 | Admitting: Family Medicine

## 2017-01-24 VITALS — BP 136/96 | HR 79 | Wt 200.0 lb

## 2017-01-24 DIAGNOSIS — M5116 Intervertebral disc disorders with radiculopathy, lumbar region: Secondary | ICD-10-CM

## 2017-01-24 DIAGNOSIS — M542 Cervicalgia: Secondary | ICD-10-CM | POA: Diagnosis not present

## 2017-01-24 DIAGNOSIS — M545 Low back pain, unspecified: Secondary | ICD-10-CM

## 2017-01-24 DIAGNOSIS — R29898 Other symptoms and signs involving the musculoskeletal system: Secondary | ICD-10-CM

## 2017-01-24 DIAGNOSIS — S060X0D Concussion without loss of consciousness, subsequent encounter: Secondary | ICD-10-CM | POA: Diagnosis not present
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
## 2017-01-24 NOTE — Patient Instructions (Signed)
Thank you for coming in today. Continue PT.  Get xray today and recheck in 2-3 weeks.  Return sooner if needed.    Lumbosacral Strain Lumbosacral strain is an injury that causes pain in the lower back (lumbosacral spine). This injury usually occurs from overstretching the muscles or ligaments along your spine. A strain can affect one or more muscles or cord-like tissues that connect bones to other bones (ligaments). What are the causes? This condition may be caused by:  A hard, direct hit (blow) to the back.  Excessive stretching of the lower back muscles. This may result from: ? A fall. ? Lifting something heavy. ? Repetitive movements such as bending or crouching.  What increases the risk? The following factors may increase your risk of getting this condition:  Participating in sports or activities that involve: ? A sudden twist of the back. ? Pushing or pulling motions.  Being overweight or obese.  Having poor strength and flexibility, especially tight hamstrings or weak muscles in the back or abdomen.  Having too much of a curve in the lower back.  Having a pelvis that is tilted forward.  What are the signs or symptoms? The main symptom of this condition is pain in the lower back, at the site of the strain. Pain may extend (radiate) down one or both legs. How is this diagnosed? This condition is diagnosed based on:  Your symptoms.  Your medical history.  A physical exam. ? Your health care provider may push on certain areas of your back to determine the source of your pain. ? You may be asked to bend forward, backward, and side to side to assess the severity of your pain and your range of motion.  Imaging tests, such as: ? X-rays. ? MRI.  How is this treated? Treatment for this condition may include:  Putting heat and cold on the affected area.  Medicines to help relieve pain and relax your muscles (muscle relaxants).  NSAIDs to help reduce swelling and  discomfort.  When your symptoms improve, it is important to gradually return to your normal routine as soon as possible to reduce pain, avoid stiffness, and avoid loss of muscle strength. Generally, symptoms should improve within 6 weeks of treatment. However, recovery time varies. Follow these instructions at home: Managing pain, stiffness, and swelling   If directed, put ice on the injured area during the first 24 hours after your strain. ? Put ice in a plastic bag. ? Place a towel between your skin and the bag. ? Leave the ice on for 20 minutes, 2-3 times a day.  If directed, put heat on the affected area as often as told by your health care provider. Use the heat source that your health care provider recommends, such as a moist heat pack or a heating pad. ? Place a towel between your skin and the heat source. ? Leave the heat on for 20-30 minutes. ? Remove the heat if your skin turns bright red. This is especially important if you are unable to feel pain, heat, or cold. You may have a greater risk of getting burned. Activity  Rest and return to your normal activities as told by your health care provider. Ask your health care provider what activities are safe for you.  Avoid activities that take a lot of energy for as long as told by your health care provider. General instructions  Take over-the-counter and prescription medicines only as told by your health care provider.  Donot drive  or use heavy machinery while taking prescription pain medicine.  Do not use any products that contain nicotine or tobacco, such as cigarettes and e-cigarettes. If you need help quitting, ask your health care provider.  Keep all follow-up visits as told by your health care provider. This is important. How is this prevented?  Use correct form when playing sports and lifting heavy objects.  Use good posture when sitting and standing.  Maintain a healthy weight.  Sleep on a mattress with medium  firmness to support your back.  Be safe and responsible while being active to avoid falls.  Do at least 150 minutes of moderate-intensity exercise each week, such as brisk walking or water aerobics. Try a form of exercise that takes stress off your back, such as swimming or stationary cycling.  Maintain physical fitness, including: ? Strength. ? Flexibility. ? Cardiovascular fitness. ? Endurance. Contact a health care provider if:  Your back pain does not improve after 6 weeks of treatment.  Your symptoms get worse. Get help right away if:  Your back pain is severe.  You cannot stand or walk.  You have difficulty controlling when you urinate or when you have a bowel movement.  You feel nauseous or you vomit.  Your feet get very cold.  You have numbness, tingling, weakness, or problems using your arms or legs.  You develop any of the following: ? Shortness of breath. ? Dizziness. ? Pain in your legs. ? Weakness in your buttocks or legs. ? Discoloration of the skin on your toes or legs. This information is not intended to replace advice given to you by your health care provider. Make sure you discuss any questions you have with your health care provider. Document Released: 02/01/2005 Document Revised: 11/12/2015 Document Reviewed: 09/26/2015 Elsevier Interactive Patient Education  2017 [employer].

## 2017-01-25 NOTE — Progress Notes (Signed)
   Bryan Walls is a 51 y.o. male who presents to [facility] today for follow up concussion, neck pain and back pain.   Dugan was seen 01/10/17 for concussion and exacerbation of back and neck pain. He has attended PT several time and done well overall. His concussion symptoms have improved overall.   However his back pain has recently worsened. He notes pain in the left lower back that does not radiate. He denies any new weakness or numbness in to the lower extremities BL.    Past Medical History:  Diagnosis Date  . 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 LUMBAR FUSION     Social History  Substance Use Topics  . Smoking status: Never Smoker  . Smokeless tobacco: Never Used  . Alcohol use No     ROS:  As above   Medications: Current Outpatient Prescriptions  Medication Sig Dispense Refill  . omeprazole (PRILOSEC) 40 MG capsule Take 40 mg by mouth daily before breakfast.     No current facility-administered medications for this visit.    No Known Allergies   Exam:  BP (!) 136/96   Pulse 79   Wt 200 lb (90.7 kg)   BMI 27.89 kg/m  General: Well Developed, well nourished, and in no acute distress.  Neuro/Psych: Alert and oriented x3, extra-ocular muscles intact, able to move all 4 extremities, sensation grossly intact. SCAT5: Total number of symptoms:  4/22 Symptom severity score:  7/132 Cognitive assessment: 5/5 Immediate memory score: 14/15 Concentration score:  3/5 Neck exam:    NL Balance exam:   NL Coordination exam:  NL Delayed recall score  4/5  Skin: Warm and dry, no rashes noted.  Respiratory: Not using accessory muscles, speaking in full sentences, trachea midline.  Cardiovascular: Pulses palpable, no extremity edema. Abdomen: Does not appear distended. MSK: Lspine:  Nontender spinal midline. Tender palpation left lumbar paraspinal muscles  decreased back motion. Normal gait.  X-ray lumbar spine pending   No results found for this or any previous visit (from the past 48 hour(s)). No results found.    Assessment and Plan: 51 y.o. male with  Concussion: Likely completely resolved. Plan for watchful waiting.  Neck pain: Doing well continue physical therapy.  Back pain: Continuing to be symptomatic. Will obtain a lumbar x-rays today. Plan to continue physical therapy and recheck in about 2 weeks. If not better we'll start proceeding with an MRI.    Orders Placed This Encounter  Procedures  . DG Lumbar Spine Complete    Standing Status:   Future    Number of Occurrences:   1    Standing Expiration Date:   03/26/2018    Order Specific Question:   Reason for Exam (SYMPTOM  OR DIAGNOSIS REQUIRED)    Answer:   eval pain left lower spine following MCV    Order Specific Question:   Preferred imaging location?    Answer:   [facility]    Order Specific Question:   Radiology Contrast Protocol - do NOT remove file path    Answer:   \\[email or web address]   No orders of the defined types were placed in this encounter.   Discussed warning signs or symptoms. Please see discharge instructions. Patient expresses understanding.  I spent 25 minutes with this patient, greater than 50% was face-to-face time counseling regarding ddx and treatment options.

## 2017-01-29 ENCOUNTER — Ambulatory Visit (INDEPENDENT_AMBULATORY_CARE_PROVIDER_SITE_OTHER): Payer: PRIVATE HEALTH INSURANCE | Admitting: Physical Therapy

## 2017-01-29 DIAGNOSIS — M545 Low back pain: Secondary | ICD-10-CM

## 2017-01-29 DIAGNOSIS — R29898 Other symptoms and signs involving the musculoskeletal system: Secondary | ICD-10-CM

## 2017-01-29 NOTE — Patient Instructions (Signed)

## 2017-01-29 NOTE — Therapy (Signed)
[place], [place], [address] Phone: [phone]   Fax:  [phone]  Physical Therapy Treatment  Patient Details  Name: Bryan Walls MRN: [id number] Date of Birth: [birth year] Referring Provider: Dr. Georgina Snell  Encounter Date: 01/29/2017      PT End of Session - 01/29/17 1237    Visit Number 5   Number of Visits 12   Date for PT Re-Evaluation 02/27/17   PT Start Time 2010   PT Stop Time 0712   PT Time Calculation (min) 61 min   Activity Tolerance Patient tolerated treatment well;No increased pain   Behavior During Therapy WFL for tasks assessed/performed      Past Medical History:  Diagnosis Date  . 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 LUMBAR FUSION      There were no vitals filed for this visit.      Subjective Assessment - 01/29/17 1155    Subjective Pt reports he has been taking Tylenol all weekend to deal with the pain. He continues to work full time. He tries to alter his position frequently (using his laptop on table then on lap).   Neck is no longer hurting.    Pertinent History Lumbar surgery 2006; HNP cervical spine no surgery; injured in [place] late 90's with continued neck and back pain. 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 Yes   Pain Score 8    Pain Location Back   Pain Orientation Lower;Right;Left   Pain Descriptors / Indicators Aching;Dull   Aggravating Factors  sitting on tailbone, bending over   Pain Relieving Factors estim, heat/ice.             [facility] PT Assessment - 01/29/17 0001      Assessment   Medical Diagnosis Cervical pain; LBP   Referring Provider Dr. Georgina Snell   Onset Date/Surgical Date 01/05/17   Hand  Dominance Right   Next MD Visit 02/09/17     AROM   Cervical - Right Side Bend 39   Cervical - Left Side Bend 37   Cervical - Right Rotation 70   Cervical - Left Rotation 67     Flexibility   Hamstrings Lt 92 deg, Rt 88 deg.           [facility] Adult PT Treatment/Exercise - 01/29/17 0001      Lumbar Exercises: Stretches   Passive Hamstring Stretch 2 reps;30 seconds   Lower Trunk Rotation --  10 reps, 5 sec each side   Prone on Elbows Stretch 2 reps;20 seconds   Piriformis Stretch 2 reps;30 seconds  each side.      Lumbar Exercises: Aerobic   Stationary Bike NuStep: (arm/leg) L5: 6 min      Lumbar Exercises: Seated   Sit to Stand 5 reps  core engaged.      Lumbar Exercises: Supine   Clam 10 reps;3 seconds  with ab set   Bent Knee Raise 10 reps  with ab set   Bent Knee Raise Limitations Opposite arm/leg lift (slow reps) x 10 reps each side.   core engaged.    Bridge 10 reps  core engaged     Lumbar Exercises: Sidelying  Hip Abduction 10 reps  2 sets each side     Lumbar Exercises: Prone   Opposite Arm/Leg Raise Right arm/Left leg;Left arm/Right leg;5 reps  with axial ext, 2 sets      Lumbar Exercises: Quadruped   Madcat/Old Horse 5 reps  to tolerance   Opposite Arm/Leg Raise Right arm/Left leg;5 reps;2 seconds   Opposite Arm/Leg Raise Limitations unable to bear weight into LLE due to knee pain.      Cryotherapy   Number Minutes Cryotherapy 15 Minutes   Cryotherapy Location Lumbar Spine   Type of Cryotherapy Ice pack     Electrical Stimulation   Electrical Stimulation Location lumbar region   Electrical Stimulation Action IFC   Electrical Stimulation Parameters to tolerance    Electrical Stimulation Goals Pain                PT Education - 01/29/17 1213    Education provided Yes   Education Details TENS info.    Person(s) Educated Patient   Methods Explanation;Handout   Comprehension Verbalized understanding             PT Long Term  Goals - 01/29/17 1158      PT LONG TERM GOAL #1   Title Improve posture and alignment with patient to demonstrate improved uproght posture 02/27/17   Time 6   Period Weeks   Status On-going     PT LONG TERM GOAL #2   Title Improve cervical and lumbar ROM to WFL's allowing patient to perform ADL's with minimal limitations 02/27/17   Time 6   Period Weeks   Status On-going     PT LONG TERM GOAL #3   Title Decrease frequency; intensity and duration of pain by 25-50% 02/27/17   Time 6   Period Weeks   Status On-going     PT LONG TERM GOAL #4   Title Independent in HEP 02/27/17   Time 6   Period Weeks   Status On-going     PT LONG TERM GOAL #5   Title Improve FOTO to </= 49% limitation 02/27/17   Time 6   Period Weeks   Status On-going               Plan - 01/29/17 1257    Clinical Impression Statement Pt's neck ROM has improved and he is no longer experiencing symptoms in that area; has partially met LTG #2. He tolerated all exercises well, except quadruped opp arm /leg due to pain in knee with WB.  Progressing towards goals.    Rehab Potential Fair   PT Frequency 2x / week   PT Duration 6 weeks   PT Treatment/Interventions Patient/family education;ADLs/Self Care Home Management;Cryotherapy;Electrical Stimulation;Iontophoresis 16m/ml Dexamethasone;Moist Heat;Ultrasound;Dry needling;Manual techniques;Therapeutic activities;Therapeutic exercise;Neuromuscular re-education   PT Next Visit Plan continue to encourage whole body movement and safe movement patterns.  modalities PRN   Consulted and Agree with Plan of Care Patient      Patient will benefit from skilled therapeutic intervention in order to improve the following deficits and impairments:  Postural dysfunction, Improper body mechanics, Pain, Decreased mobility, Decreased activity tolerance  Visit Diagnosis: Acute left-sided low back pain, with sciatica presence unspecified  Other symptoms and signs involving  the musculoskeletal system     Problem List Patient Active Problem List   Diagnosis Date Noted  . Multiple thyroid nodules 12/16/2015  . Synovitis of finger 06/15/2015  . Vitamin D deficiency 01/28/2010  . LOW BACK PAIN, CHRONIC 08/17/2009  .  UNSPECIFIED ADJUSTMENT REACTION 02/05/2008   Kerin Perna, PTA 01/29/17 1:58 PM  [facility] [place], [place], [address] Phone: [phone]   Fax:  [phone]  Name: Bryan Walls MRN: [id number] Date of Birth: 14-Apr-1966

## 2017-01-31 ENCOUNTER — Ambulatory Visit (INDEPENDENT_AMBULATORY_CARE_PROVIDER_SITE_OTHER): Payer: Self-pay | Admitting: Physical Therapy

## 2017-01-31 DIAGNOSIS — M545 Low back pain: Secondary | ICD-10-CM

## 2017-01-31 DIAGNOSIS — M542 Cervicalgia: Secondary | ICD-10-CM

## 2017-01-31 DIAGNOSIS — R29898 Other symptoms and signs involving the musculoskeletal system: Secondary | ICD-10-CM

## 2017-01-31 NOTE — Therapy (Signed)
[place], [place], [address] Phone: [phone]   Fax:  [phone]  Physical Therapy Treatment  Patient Details  Name: Bryan Walls MRN: [id number] Date of Birth: Dec 02, 1965 Referring Provider: Dr. Georgina Snell  Encounter Date: 01/31/2017      PT End of Session - 01/31/17 1603    Visit Number 6   Number of Visits 12   Date for PT Re-Evaluation 02/27/17   PT Start Time 1520   PT Stop Time 1614   PT Time Calculation (min) 54 min   Activity Tolerance Patient tolerated treatment well   Behavior During Therapy [facility] for tasks assessed/performed      Past Medical History:  Diagnosis Date  . 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 LUMBAR FUSION      There were no vitals filed for this visit.      Subjective Assessment - 01/31/17 1531    Subjective Bryan Walls reports the exercise (bird dog in quadruped) stirred up his neck pain. Now his lumbar area is painfree and his neck is painful.  He has done some exercise but no stretches for neck, nor heat/ice. He's just too busy with work.    Pertinent History Lumbar surgery 2006; HNP cervical spine no surgery; injured in [place] late 90's with continued neck and back pain. 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 Yes   Pain Score 4    Pain Location Neck   Pain Orientation Right;Left;Lower   Pain Descriptors / Indicators Aching   Aggravating Factors  quadruped position   Pain Relieving Factors estim, ice/heat.             [facility] PT Assessment - 01/31/17 0001      Assessment   Medical Diagnosis Cervical pain; LBP   Referring Provider Dr. Georgina Snell   Onset Date/Surgical Date 01/05/17   Hand Dominance Right   Next  MD Visit 02/09/17     AROM   Lumbar Flexion touching just below knees with fingers   Lumbar - Right Rotation WNL   Lumbar - Left Rotation WNL          [facility] Adult PT Treatment/Exercise - 01/31/17 0001      Neck Exercises: Standing   Other Standing Exercises bilat shoulder ext with retraction x 10 reps - tactile/VC for form.      Lumbar Exercises: Stretches   Passive Hamstring Stretch 2 reps;30 seconds   Piriformis Stretch 2 reps;30 seconds     Lumbar Exercises: Aerobic   Stationary Bike L3: 7 min      Lumbar Exercises: Standing   Row Strengthening;Both;10 reps;Theraband   Theraband Level (Row) Level 2 (Red)   Other Standing Lumbar Exercises squat with dowl behind back to ensure hip hinge and straight back x 8 reps.      Cryotherapy   Number Minutes Cryotherapy 15 Minutes   Cryotherapy Location Cervical   Type of Cryotherapy Ice pack     Electrical Stimulation   Electrical Stimulation Location upper thoracic paraspinal    Electrical Stimulation Action IFC   Electrical Stimulation Parameters to tolerance    Electrical Stimulation Goals Pain     Manual Therapy   Manual Therapy  Soft tissue mobilization;Myofascial release   Soft tissue mobilization STM to bilat thoracic paraspinals, upper trap, scalene.    Myofascial Release suboccipital release, MFR to cervical and thoracic paraspinals.      Neck Exercises: Stretches   Other Neck Stretches 3 position doorway stretch x 30 sec x 2 reps each position, VC for form.    Other Neck Stretches cervical flexion, diagonals in sitting position with forearms on thighs                      PT Long Term Goals - 01/29/17 1158      PT LONG TERM GOAL #1   Title Improve posture and alignment with patient to demonstrate improved uproght posture 02/27/17   Time 6   Period Weeks   Status On-going     PT LONG TERM GOAL #2   Title Improve cervical and lumbar ROM to WFL's allowing patient to perform ADL's with minimal  limitations 02/27/17   Time 6   Period Weeks   Status On-going     PT LONG TERM GOAL #3   Title Decrease frequency; intensity and duration of pain by 25-50% 02/27/17   Time 6   Period Weeks   Status On-going     PT LONG TERM GOAL #4   Title Independent in HEP 02/27/17   Time 6   Period Weeks   Status On-going     PT LONG TERM GOAL #5   Title Improve FOTO to </= 49% limitation 02/27/17   Time 6   Period Weeks   Status On-going               Plan - 01/31/17 1601    Clinical Impression Statement Bird dog exercise irritated neck after last session.  LBP has improved.  lumbar rotation ROM has improved. Encouraged pt to take freq breaks from computer to avoid continual neck irritation.  Making progress each visit towards established goals.    Rehab Potential Fair   PT Frequency 2x / week   PT Duration 6 weeks   PT Treatment/Interventions Patient/family education;ADLs/Self Care Home Management;Cryotherapy;Electrical Stimulation;Iontophoresis 4mg /ml Dexamethasone;Moist Heat;Ultrasound;Dry needling;Manual techniques;Therapeutic activities;Therapeutic exercise;Neuromuscular re-education   PT Next Visit Plan continue to encourage whole body movement and safe movement patterns.  progress HEP as tolerated.    Consulted and Agree with Plan of Care Patient      Patient will benefit from skilled therapeutic intervention in order to improve the following deficits and impairments:  Postural dysfunction, Improper body mechanics, Pain, Decreased mobility, Decreased activity tolerance  Visit Diagnosis: Acute left-sided low back pain, with sciatica presence unspecified  Other symptoms and signs involving the musculoskeletal system  Cervicalgia     Problem List Patient Active Problem List   Diagnosis Date Noted  . Multiple thyroid nodules 12/16/2015  . Synovitis of finger 06/15/2015  . Vitamin D deficiency 01/28/2010  . LOW BACK PAIN, CHRONIC 08/17/2009  . UNSPECIFIED  ADJUSTMENT REACTION 02/05/2008   Bryan Walls, PTA 01/31/17 5:03 PM  [place], [place], [address] Phone: [phone]   Fax:  [phone]  Name: Bryan Walls MRN: [id number] Date of Birth: 02-19-1966

## 2017-02-05 ENCOUNTER — Ambulatory Visit (INDEPENDENT_AMBULATORY_CARE_PROVIDER_SITE_OTHER): Payer: PRIVATE HEALTH INSURANCE | Admitting: Physical Therapy

## 2017-02-05 DIAGNOSIS — R29898 Other symptoms and signs involving the musculoskeletal system: Secondary | ICD-10-CM | POA: Diagnosis not present

## 2017-02-05 DIAGNOSIS — M542 Cervicalgia: Secondary | ICD-10-CM

## 2017-02-05 DIAGNOSIS — M545 Low back pain: Secondary | ICD-10-CM

## 2017-02-05 NOTE — Therapy (Signed)
Bryan Walls, [place], [address] Phone: [phone]   Fax:  [phone]  Physical Therapy Treatment  Patient Details  Name: Bryan Walls MRN: [id number] Date of Birth: March 23, 1966 Referring Provider: Dr. Georgina Snell  Encounter Date: 02/05/2017      PT End of Session - 02/05/17 1152    Visit Number 7   Number of Visits 12   Date for PT Re-Evaluation 02/27/17   PT Start Time 1150   PT Stop Time 1247   PT Time Calculation (min) 57 min      Past Medical History:  Diagnosis Date  . Syndrome X, cardiac [facility])    history    Past Surgical History:  Procedure Laterality Date  . LUMBAR FUSION      There were no vitals filed for this visit.      Subjective Assessment - 02/05/17 1152    Subjective [name]. 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 He tried Ice to LB on Saturday; reduced pain some.  He reports 90% reduction in symptoms since initiating therapy.    Pertinent History Lumbar surgery 2006; HNP cervical spine no surgery; injured in [place] late 90's with continued neck and back pain. 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 Yes   Pain Score 4    Pain Location Back   Pain Orientation Lower   Aggravating Factors  prolonged sitting   Pain Relieving Factors estim ice/ heat             [facility] PT Assessment - 02/05/17 0001      Assessment   Medical Diagnosis Cervical pain; LBP   Referring Provider Dr. Georgina Snell   Onset Date/Surgical Date 01/05/17   Hand Dominance Right   Next MD Visit 02/09/17     AROM   Cervical Flexion 40   Cervical Extension 30   Cervical - Right Side Bend 40   Cervical - Left Side Bend 47   Cervical - Right Rotation 71   Cervical - Left Rotation 70           [facility] Adult PT Treatment/Exercise - 02/05/17 0001      Lumbar Exercises: Stretches   Lower Trunk Rotation --  10 reps, 5 sec each side   Piriformis Stretch 2 reps;30 seconds     Lumbar Exercises: Aerobic   Stationary Bike L3: 5.5 min    UBE (Upper Arm Bike) L3: 1 min forward/1 min backward.      Lumbar Exercises: Standing   Row Strengthening;Both;10 reps;Theraband   Theraband Level (Row) Level 3 (Green)   Row Limitations [employer] /tactile cues for form.    Shoulder Extension Strengthening;Both;10 reps;Theraband   Theraband Level (Shoulder Extension) Level 3 (Green)     Lumbar Exercises: Supine   Other Supine Lumbar Exercises pilates heel taps x 10 each leg     Lumbar Exercises: Sidelying   Hip Abduction 10 reps  pilates hot potato, VC for form.      Lumbar Exercises: Prone   Opposite Arm/Leg Raise Right arm/Left leg;Left arm/Right leg;10 reps;2 seconds   Other Prone Lumbar Exercises childs pose with lateral trunk flexion x 10 sec x 2 reps each side.      Modalities  Modalities Cryotherapy;Electrical Stimulation     Cryotherapy   Number Minutes Cryotherapy 15 Minutes   Cryotherapy Location Lumbar Spine   Type of Cryotherapy Ice pack     Electrical Stimulation   Electrical Stimulation Location low back paraspinals   Electrical Stimulation Action IFC   Electrical Stimulation Parameters  to tolerance   Electrical Stimulation Goals Pain     Neck Exercises: Stretches   Other Neck Stretches 3 position doorway stretch x 30 sec x 2 reps each.                      PT Long Term Goals - 02/05/17 1155      PT LONG TERM GOAL #1   Title Improve posture and alignment with patient to demonstrate improved uproght posture 02/27/17   Time 6   Period Weeks   Status On-going     PT LONG TERM GOAL #2   Title Improve cervical and lumbar ROM to WFL's allowing patient to perform ADL's with minimal limitations 02/27/17   Time 6   Period Weeks   Status Partially  Met     PT LONG TERM GOAL #3   Title Decrease frequency; intensity and duration of pain by 25-50% 02/27/17   Time 6   Period Weeks   Status Achieved     PT LONG TERM GOAL #4   Title Independent in HEP 02/27/17   Time 6   Period Weeks   Status On-going     PT LONG TERM GOAL #5   Title Improve FOTO to </= 49% limitation 02/27/17   Time 6   Period Weeks   Status On-going               Plan - 02/05/17 1224    Clinical Impression Statement Pt tolerated opp arm / leg lift much better this session.  Neck pain is once again resolved; improved ROM as well.  Pt tolerated treatment without increase in symptoms.  Continued encouragement to imprved workstation/ posture during day to decrease LBP.  Pt has partially met LTG #2 and has met LTG #3.    PT Frequency 2x / week   PT Duration 6 weeks   PT Treatment/Interventions Patient/family education;ADLs/Self Care Home Management;Cryotherapy;Electrical Stimulation;Iontophoresis 45m/ml Dexamethasone;Moist Heat;Ultrasound;Dry needling;Manual techniques;Therapeutic activities;Therapeutic exercise;Neuromuscular re-education   PT Next Visit Plan continue spinal stabilization/core strengthening. Modalities as indicated.       Patient will benefit from skilled therapeutic intervention in order to improve the following deficits and impairments:  Postural dysfunction, Improper body mechanics, Pain, Decreased mobility, Decreased activity tolerance  Visit Diagnosis: Acute left-sided low back pain, with sciatica presence unspecified  Other symptoms and signs involving the musculoskeletal system  Cervicalgia     Problem List Patient Active Problem List   Diagnosis Date Noted  . Multiple thyroid nodules 12/16/2015  . Synovitis of finger 06/15/2015  . Vitamin D deficiency 01/28/2010  . LOW BACK PAIN, CHRONIC 08/17/2009  . UNSPECIFIED ADJUSTMENT REACTION 02/05/2008   JKerin Perna PTA 02/05/17 12:37 PM  [address] [address]Phone: [phone]  Fax:  [phone] Name: Bryan LINVILLEMRN: [id number] of Birth: 105-Apr-1967

## 2017-02-07 ENCOUNTER — Ambulatory Visit (INDEPENDENT_AMBULATORY_CARE_PROVIDER_SITE_OTHER): Payer: Self-pay | Admitting: Physical Therapy

## 2017-02-07 DIAGNOSIS — M545 Low back pain: Secondary | ICD-10-CM

## 2017-02-07 DIAGNOSIS — R29898 Other symptoms and signs involving the musculoskeletal system: Secondary | ICD-10-CM
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
## 2017-02-09 ENCOUNTER — Ambulatory Visit (INDEPENDENT_AMBULATORY_CARE_PROVIDER_SITE_OTHER): Payer: 59 | Admitting: Family Medicine

## 2017-02-09 VITALS — BP 131/85 | HR 78 | Wt 196.0 lb

## 2017-02-09 DIAGNOSIS — S39012A Strain of muscle, fascia and tendon of lower back, initial encounter: Secondary | ICD-10-CM

## 2017-02-09 DIAGNOSIS — M545 Low back pain, unspecified: Secondary | ICD-10-CM
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
## 2017-02-09 NOTE — Patient Instructions (Signed)
Thank you for coming in today. Continue PT.  Recheck with me in 6 weeks.  Return sooner if needed.

## 2017-02-12 ENCOUNTER — Encounter: Payer: Self-pay | Admitting: Physical Therapy

## 2017-02-14 ENCOUNTER — Encounter: Payer: Self-pay | Admitting: Physical Therapy

## 2017-02-16 ENCOUNTER — Ambulatory Visit (INDEPENDENT_AMBULATORY_CARE_PROVIDER_SITE_OTHER): Payer: Self-pay | Admitting: Physical Therapy

## 2017-02-16 DIAGNOSIS — R29898 Other symptoms and signs involving the musculoskeletal system: Secondary | ICD-10-CM

## 2017-02-16 DIAGNOSIS — M545 Low back pain: Secondary | ICD-10-CM
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
## 2017-02-23 ENCOUNTER — Ambulatory Visit (INDEPENDENT_AMBULATORY_CARE_PROVIDER_SITE_OTHER): Payer: Self-pay | Admitting: Physical Therapy

## 2017-02-23 DIAGNOSIS — M542 Cervicalgia: Secondary | ICD-10-CM

## 2017-02-23 DIAGNOSIS — M545 Low back pain: Secondary | ICD-10-CM

## 2017-02-23 DIAGNOSIS — R29898 Other symptoms and signs involving the musculoskeletal system: Secondary | ICD-10-CM

## 2017-02-23 NOTE — Therapy (Signed)
[place], [place], [address] Phone: [phone]   Fax:  [phone]  Physical Therapy Treatment  Patient Details  Name: ARSENIY TOOMEY MRN: [id number] Date of Birth: Sep 24, 1965 Referring Provider: Dr. Georgina Snell   Encounter Date: 02/23/2017      PT End of Session - 02/23/17 1337    Visit Number 10   Number of Visits 12   Date for PT Re-Evaluation 02/27/17   PT Start Time 1332   PT Stop Time 1422  ice pack last 12 min    PT Time Calculation (min) 50 min   Activity Tolerance Patient tolerated treatment well;No increased pain   Behavior During Therapy WFL for tasks assessed/performed      Past Medical History:  Diagnosis Date  . Syndrome X, cardiac [facility])    history    Past Surgical History:  Procedure Laterality Date  . LUMBAR FUSION      There were no vitals filed for this visit.      Subjective Assessment - 02/23/17 1337    Subjective Vinayak reports he know does the piriformis stretch when his back / hip starts to bother him and this helps.  "things are improving".  He had an injection in his Lt knee at [place] past wk.    Currently in Pain? Yes   Pain Score 2    Pain Location Back   Pain Orientation Lower   Pain Descriptors / Indicators Dull   Aggravating Factors  prolonged sitting   Pain Relieving Factors ice, stretching            [facility] PT Assessment - 02/23/17 0001      Assessment   Medical Diagnosis Cervical pain; LBP   Referring Provider Dr. Georgina Snell    Onset Date/Surgical Date 01/05/17   Hand Dominance Right   Next MD Visit 6 wks          [facility] Adult PT Treatment/Exercise - 02/23/17 0001      Neck Exercises: Supine   Other Supine Exercise hooklying on full foam roller:  snow angels x 10 reps; dead bug (shoulder flexion to end range, and hip flexion) x 10 reps, then 10 reps with heel slides   Other Supine Exercise pilates table top heel taps x 20 reps      Lumbar  Exercises: Stretches   Passive Hamstring Stretch 2 reps;30 seconds   Piriformis Stretch 3 reps;30 seconds     Lumbar Exercises: Aerobic   Elliptical L3: 4 min      Lumbar Exercises: Prone   Opposite Arm/Leg Raise Right arm/Left leg;Left arm/Right leg;10 reps;2 seconds   Other Prone Lumbar Exercises childs pose with lateral trunk flexion x 10 sec x 2 reps each side.    Other Prone Lumbar Exercises core engaged with upper body lifts x 8 reps - with goal post arm to/from superman position.      Lumbar Exercises: Quadruped   Plank 2 reps: one with shoulder taps x 5 each shoulder, then one with hip ext x 5 reps each leg.      Cryotherapy   Number Minutes Cryotherapy 12 Minutes   Cryotherapy Location Lumbar Spine;Cervical   Type of Cryotherapy Ice pack                     PT Long Term Goals - 02/16/17 6962      PT LONG TERM GOAL #1   Title Improve posture and alignment with patient to  demonstrate improved upright posture 02/27/17   Time 6   Period Weeks   Status On-going     PT LONG TERM GOAL #2   Title Improve cervical and lumbar ROM to WFL's allowing patient to perform ADL's with minimal limitations 02/27/17   Time 6   Period Weeks   Status Partially Met     PT LONG TERM GOAL #3   Title Decrease frequency; intensity and duration of pain by 25-50% 02/27/17   Time 6   Period Weeks   Status Achieved     PT LONG TERM GOAL #4   Title Independent in HEP 02/27/17   Time 6   Period Weeks   Status On-going     PT LONG TERM GOAL #5   Title Improve FOTO to </= 49% limitation 02/27/17   Time 6   Period Weeks   Status On-going               Plan - 02/23/17 1549    Clinical Impression Statement Pt continues to report reduction of low back pain, even more so now that he has modified his work station and the way he washes legs in shower.  He tolerated all exercises well, with min/no increase in pain.  Pt making great gains towards remaining goals.    Rehab  Potential Fair   PT Frequency 2x / week   PT Duration 6 weeks   PT Treatment/Interventions Patient/family education;ADLs/Self Care Home Management;Cryotherapy;Electrical Stimulation;Iontophoresis 25m/ml Dexamethasone;Moist Heat;Ultrasound;Dry needling;Manual techniques;Therapeutic activities;Therapeutic exercise;Neuromuscular re-education   PT Next Visit Plan assess goals, end of POC. FOTO   Consulted and Agree with Plan of Care Patient      Patient will benefit from skilled therapeutic intervention in order to improve the following deficits and impairments:  Postural dysfunction, Improper body mechanics, Pain, Decreased mobility, Decreased activity tolerance  Visit Diagnosis: Acute left-sided low back pain, with sciatica presence unspecified  Other symptoms and signs involving the musculoskeletal system  Cervicalgia     Problem List Patient Active Problem List   Diagnosis Date Noted  . Lumbosacral strain 02/09/2017  . Multiple thyroid nodules 12/16/2015  . Synovitis of finger 06/15/2015  . Vitamin D deficiency 01/28/2010  . LOW BACK PAIN, CHRONIC 08/17/2009  . UNSPECIFIED ADJUSTMENT REACTION 02/05/2008   JKerin Perna PTA 02/23/17 3:52 PM  [facility] [place] [address]Phone: [phone]  Fax:  [phone] Name: RDAMARE SERANOMRN: [id number] of Birth: 1February 26, 1967

## 2017-02-26 ENCOUNTER — Ambulatory Visit (INDEPENDENT_AMBULATORY_CARE_PROVIDER_SITE_OTHER): Payer: Self-pay | Admitting: Physical Therapy

## 2017-02-26 DIAGNOSIS — M545 Low back pain: Secondary | ICD-10-CM

## 2017-02-26 DIAGNOSIS — R29898 Other symptoms and signs involving the musculoskeletal system: Secondary | ICD-10-CM

## 2017-02-26 DIAGNOSIS — M542 Cervicalgia: Secondary | ICD-10-CM

## 2017-02-26 NOTE — Therapy (Addendum)
[place], [place], [address] Phone: [phone]   Fax:  [phone]  Physical Therapy Treatment  Patient Details  Name: Bryan Walls MRN: [id number] Date of Birth: 1965-10-09 Referring Provider: Dr. Georgina Snell   Encounter Date: 02/26/2017      PT End of Session - 02/26/17 1612    Visit Number 11   Number of Visits 12   Date for PT Re-Evaluation 02/27/17   PT Start Time 1608  pt arrived late   PT Stop Time 1646   PT Time Calculation (min) 38 min   Activity Tolerance Patient tolerated treatment well   Behavior During Therapy [facility] for tasks assessed/performed      Past Medical History:  Diagnosis Date  . Syndrome X, cardiac [facility])    history    Past Surgical History:  Procedure Laterality Date  . LUMBAR FUSION      There were no vitals filed for this visit.      Subjective Assessment - 02/26/17 1612    Subjective Pt reports he has been doing some sort of exercise daily. He knows he needs to carve out time to do it to keep his back in shape.     Currently in Pain? Yes   Pain Score 2    Pain Location Back   Pain Orientation Lower   Pain Descriptors / Indicators Dull   Aggravating Factors  prolonged sitting   Pain Relieving Factors ice, stretching            [facility] PT Assessment - 02/26/17 0001      AROM   Lumbar Flexion WNL- can touch toes   Lumbar Extension WNL   Lumbar - Right Side Bend WNL   pain in center of back @ end range.    Lumbar - Left Side Bend WNL   Lumbar - Right Rotation WNL   Lumbar - Left Rotation WNL         [facility] Adult PT Treatment/Exercise - 02/26/17 0001      Lumbar Exercises: Stretches   Passive Hamstring Stretch 2 reps;30 seconds   Piriformis Stretch 3 reps;30 seconds     Lumbar Exercises: Aerobic   Elliptical L3: 4 min      Lumbar Exercises: Supine   Other Supine Lumbar Exercises single knee tuck with opp arm hugging knee and head off of  mat (other leg straight, elevated off of table) x 10 reps, 2 sets.      Lumbar Exercises: Sidelying   Other Sidelying Lumbar Exercises side plank x 10-15 sec each side, 2 reps      Lumbar Exercises: Prone   Opposite Arm/Leg Raise Right arm/Left leg;Left arm/Right leg;10 reps   Other Prone Lumbar Exercises childs pose with lateral trunk flexion x 10 sec x 2 reps each side.    Other Prone Lumbar Exercises leg raises with heel taps x 5 reps x 2 sets     Knee/Hip Exercises: Stretches   Gastroc Stretch Right;Left;2 reps;30 seconds     Modalities   Modalities --  pt declined            PT Long Term Goals - 02/26/17 1615      PT LONG TERM GOAL #1   Title Improve posture and alignment with patient to demonstrate improved upright posture 02/27/17   Time 6   Period Weeks   Status Achieved     PT LONG TERM GOAL #2   Title Improve cervical and lumbar  ROM to WFL's allowing patient to perform ADL's with minimal limitations 02/27/17   Time 6   Period Weeks   Status Partially Met     PT LONG TERM GOAL #3   Title Decrease frequency; intensity and duration of pain by 25-50% 02/27/17   Status Achieved     PT LONG TERM GOAL #4   Title Independent in HEP 02/27/17   Period Weeks   Status Achieved     PT LONG TERM GOAL #5   Title Improve FOTO to </= 49% limitation 02/27/17   Time 6   Period Weeks   Status Achieved               Plan - 02/26/17 1650    Clinical Impression Statement Pt demonstrated improved lumbar ROM; is now only limited in Rt side bending.  Pt tolerated all exercises well, without any increase in pain. Pt declined modalities as he will do them at home if needed. Pt requests to hold therapy for 2 wks while he works on his HEP.  He has a busy wk in [place] next wk and this will be a test to how he is doing.    Rehab Potential Fair   PT Frequency 2x / week   PT Duration 6 weeks   PT Treatment/Interventions Patient/family education;ADLs/Self Care Home  Management;Cryotherapy;Electrical Stimulation;Iontophoresis 33m/ml Dexamethasone;Moist Heat;Ultrasound;Dry needling;Manual techniques;Therapeutic activities;Therapeutic exercise;Neuromuscular re-education   PT Next Visit Plan Spoke to supervising PT; will hold until 11/6 per pt request.  D/C if pt doesn't return by then.    Consulted and Agree with Plan of Care Patient      Patient will benefit from skilled therapeutic intervention in order to improve the following deficits and impairments:  Postural dysfunction, Improper body mechanics, Pain, Decreased mobility, Decreased activity tolerance  Visit Diagnosis: Acute left-sided low back pain, with sciatica presence unspecified  Other symptoms and signs involving the musculoskeletal system  Cervicalgia     Problem List Patient Active Problem List   Diagnosis Date Noted  . Lumbosacral strain 02/09/2017  . Multiple thyroid nodules 12/16/2015  . Synovitis of finger 06/15/2015  . Vitamin D deficiency 01/28/2010  . LOW BACK PAIN, CHRONIC 08/17/2009  . UNSPECIFIED ADJUSTMENT REACTION 02/05/2008   JKerin Perna PTA 02/26/17 5:00 PM  [place] [address]Phone: [phone]  Fax:  [phone] Name: Bryan SHAMMASMRN: [id number] of Birth: [birth year] PHYSICAL THERAPY DISCHARGE SUMMARY  Visits from Start of Care: 11  Current functional level related to goals / functional outcomes: See last progress note for discharge status   Remaining deficits: See last note    Education / Equipment: HEP  Plan: Patient agrees to discharge.  Patient goals were met. Patient is being discharged due to meeting the stated rehab goals.  ?????     Celyn P. HHelene KelpPT, MPH 03/26/17 9:23 AM

## 2017-03-26 ENCOUNTER — Encounter: Payer: Self-pay | Admitting: Family Medicine

## 2017-03-26 ENCOUNTER — Ambulatory Visit (INDEPENDENT_AMBULATORY_CARE_PROVIDER_SITE_OTHER): Payer: PRIVATE HEALTH INSURANCE | Admitting: Family Medicine

## 2017-03-26 DIAGNOSIS — M542 Cervicalgia: Secondary | ICD-10-CM | POA: Diagnosis not present

## 2017-03-26 NOTE — Patient Instructions (Addendum)
Thank you for coming in today. You should hear about the MRI of the cspine soon.  Let me know if you do not hear anything.  Please check back with me a few days after the MRI.
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
## 2017-04-09 ENCOUNTER — Ambulatory Visit (INDEPENDENT_AMBULATORY_CARE_PROVIDER_SITE_OTHER): Payer: Self-pay

## 2017-04-09 DIAGNOSIS — M542 Cervicalgia: Secondary | ICD-10-CM

## 2017-04-09 DIAGNOSIS — M4802 Spinal stenosis, cervical region: Secondary | ICD-10-CM

## 2017-04-10 ENCOUNTER — Encounter: Payer: Self-pay | Admitting: Family Medicine

## 2017-04-10 ENCOUNTER — Telehealth: Payer: Self-pay | Admitting: Family Medicine

## 2017-04-10 DIAGNOSIS — E041 Nontoxic single thyroid nodule: Secondary | ICD-10-CM

## 2017-04-10 NOTE — Telephone Encounter (Signed)
Ultrasound thyroid ordered to follow up nodule seen on MRI C spine.

## 2017-04-27 ENCOUNTER — Ambulatory Visit (INDEPENDENT_AMBULATORY_CARE_PROVIDER_SITE_OTHER): Payer: 59 | Admitting: Family Medicine

## 2017-04-27 ENCOUNTER — Encounter: Payer: Self-pay | Admitting: Family Medicine

## 2017-04-27 VITALS — BP 142/106 | HR 71 | Ht 71.0 in | Wt 200.0 lb

## 2017-04-27 DIAGNOSIS — M545 Low back pain, unspecified: Secondary | ICD-10-CM

## 2017-04-27 DIAGNOSIS — M542 Cervicalgia: Secondary | ICD-10-CM

## 2017-04-27 NOTE — Patient Instructions (Signed)
Thank you for coming in today. You should hear from Dr Ezekiel Slocumb office at [place] soon.  Let me know if you dont hear anything soon.  Recheck in 2 months.
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
## 2017-05-21 ENCOUNTER — Encounter: Payer: Self-pay | Admitting: Family Medicine

## 2017-05-21 ENCOUNTER — Other Ambulatory Visit (INDEPENDENT_AMBULATORY_CARE_PROVIDER_SITE_OTHER): Payer: PRIVATE HEALTH INSURANCE

## 2017-05-21 ENCOUNTER — Ambulatory Visit (INDEPENDENT_AMBULATORY_CARE_PROVIDER_SITE_OTHER): Payer: 59 | Admitting: Family Medicine

## 2017-05-21 VITALS — BP 140/80 | HR 90 | Ht 71.0 in | Wt 202.0 lb

## 2017-05-21 DIAGNOSIS — M545 Low back pain, unspecified: Secondary | ICD-10-CM

## 2017-05-21 DIAGNOSIS — M5412 Radiculopathy, cervical region: Secondary | ICD-10-CM | POA: Diagnosis not present

## 2017-05-21 DIAGNOSIS — M542 Cervicalgia: Secondary | ICD-10-CM

## 2017-05-21 LAB — IBC PANEL
Iron: 83 ug/dL (ref 42–165)
Saturation Ratios: 23.7 % (ref 20.0–50.0)
Transferrin: 250 mg/dL (ref 212.0–360.0)

## 2017-05-21 LAB — URIC ACID: URIC ACID, SERUM: 8.6 mg/dL — AB (ref 4.0–7.8)

## 2017-05-21 LAB — SEDIMENTATION RATE: Sed Rate: 9 mm/hr (ref 0–15)

## 2017-05-21 MED ORDER — GABAPENTIN 100 MG PO CAPS: 200.0000 mg | ORAL_CAPSULE | Freq: Every day | ORAL | 3 refills | Status: DC

## 2017-05-21 MED ORDER — VENLAFAXINE HCL ER 37.5 MG PO CP24: 37.5000 mg | ORAL_CAPSULE | Freq: Every day | ORAL | 1 refills | Status: DC
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
## 2017-05-21 NOTE — Patient Instructions (Signed)
Good to see you  pennsaid pinkie amount topically 2 times daily as needed.  Gabapentin 200mg  at night Effexor 37.5 mg daily  Keep doing the exercises Labs downstairs to see if we are missing anything.  Over the counter get  Tart cherry extract any dose at night Tylenol 500mg  3 times a day  See me again in 3-4 weeks  I will write you on the labs

## 2017-05-21 NOTE — Assessment & Plan Note (Signed)
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  Patient will likely continue to have this.  Patient did bring other laboratory workup including an EMG that was remarkable for radicular symptoms.  Likely will be chronic.  Gabapentin given.  At follow-up we will consider starting osteopathic manipulation when patient is able to tolerate it.

## 2017-05-21 NOTE — Assessment & Plan Note (Signed)
Chronic back pain bilaterally.  Significant discomfort overall.  Has had vitamin D deficiency previously.  Workup included MRIs of the cervical and lumbar spine time.  Patient started on low-dose of Effexor today that I think will be beneficial as well as gabapentin.  We discussed icing regimen, patient will get laboratory workup to rule out such other things that could be contributing to some of the discomfort and pain.  Icing regimen.  Follow-up with me again in 3 weeks.  Likely could be a candidate for osteopathic manipulation but do not feel that it would be possible at this time.

## 2017-05-24 LAB — PTH, INTACT AND CALCIUM
Calcium: 10.1 mg/dL (ref 8.6–10.3)
PTH: 71 pg/mL — ABNORMAL HIGH (ref 14–64)

## 2017-05-24 LAB — RHEUMATOID FACTOR

## 2017-05-24 LAB — ANGIOTENSIN CONVERTING ENZYME: Angiotensin-Converting Enzyme: 40 U/L (ref 9–67)

## 2017-05-24 LAB — ANA: Anti Nuclear Antibody(ANA): NEGATIVE

## 2017-05-24 LAB — CYCLIC CITRUL PEPTIDE ANTIBODY, IGG: Cyclic Citrullin Peptide Ab: <16 UNITS

## 2017-05-24 LAB — VITAMIN D 1,25 DIHYDROXY
VITAMIN D2 1, 25 (OH): 29 pg/mL
VITAMIN D3 1, 25 (OH): 27 pg/mL
Vitamin D 1, 25 (OH)2 Total: 56 pg/mL (ref 18–72)

## 2017-06-17 NOTE — Progress Notes (Signed)
[facility] [place], [place] [address] Phone: [phone] Subjective:     CC: Neck and back pain follow-up  [id number]  Bryan Walls is a 52 y.o. male coming in with complaint of neck and back pain.  Patient was seen 3 weeks ago.  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  Was having radicular signs and symptoms in the C8 distribution.  Started on gabapentin as well as Effexor.  Patient was given home exercises.  Laboratory workup also showed the patient did have gout.  Patient states that he was diagnosed with damaged kidneys since last visit. He has been taking gabapentin and discontinued another medication that was prescribed due to the kidney damage. Overall his neck pain has been more manageable. He still has pain after work. His lumbar spine pain has also improved.   At least 80% better     Past Medical History:  Diagnosis Date  . 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 LUMBAR FUSION     Social History   Socioeconomic History  . Marital status: Married    Spouse name: Not on file  . Number of children: Not on file  . Years of education: Not on file  . Highest education level: Not on file  Social Needs  . Financial resource strain: Not on file  . Food insecurity - worry: Not on file  . Food insecurity - inability: Not on file  . Transportation needs - medical: Not on file  . Transportation needs - non-medical: Not on file  Occupational History  . Not on file  Tobacco Use  . Smoking status: Never Smoker  . Smokeless tobacco: Never Used  Substance and Sexual Activity  . Alcohol use: No  . Drug use: No  . Sexual activity: Not on file  Other Topics Concern  . Not on file  Social History Narrative  . Not on file   No Known Allergies Family History  Problem Relation Age of Onset  . Cancer Mother        cervical  . Diabetes  Mother   . Hypertension Father      Past medical history, social, surgical and family history all reviewed in electronic medical record.  No pertanent information unless stated regarding to the chief complaint.   Review of Systems:Review of systems updated and as accurate as of 06/18/17  No headache, visual changes, nausea, vomiting, diarrhea, constipation, dizziness, abdominal pain, skin rash, fevers, chills, night sweats, weight loss, swollen lymph nodes, body aches, joint swelling, chest pain, shortness of breath, mood changes.   Objective  Blood pressure (!) 142/98, pulse 85, height 5\' 11"  (1.803 m), weight 202 lb (91.6 kg), SpO2 98 %. Systems examined below as of 06/18/17   General: No apparent distress alert and oriented x3 mood and affect normal, dressed appropriately.  HEENT: Pupils equal, extraocular movements intact  Respiratory: Patient's speak in full sentences and does not appear short of breath  Cardiovascular: No lower extremity edema, non tender, no erythema  Skin: Warm dry intact with no signs of infection or rash on extremities or on axial skeleton.  Abdomen: Soft nontender  Neuro: Cranial nerves II through XII are intact, neurovascularly intact in all extremities with 2+ DTRs and 2+ pulses.  Lymph: No lymphadenopathy of posterior or anterior cervical chain or axillae bilaterally.  Gait normal with good balance and  coordination.  MSK:  Non tender with full range of motion and good stability and symmetric strength and tone of shoulders, elbows, wrist, hip, knee and ankles bilaterally.  Neck: Inspection mild loss of lordosis.Marland Kitchen No palpable stepoffs. Negative Spurling's maneuver. Near full range of motion Grip strength and sensation normal in bilateral hands Strength good C4 to T1 distribution No sensory change to C4 to T1 Negative Hoffman sign bilaterally Reflexes normal    Impression and Recommendations:     This case required medical decision making of moderate  complexity.      Note: This dictation was prepared with Dragon dictation along with smaller phrase technology. Any transcriptional errors that result from this process are unintentional.

## 2017-06-18 ENCOUNTER — Ambulatory Visit (INDEPENDENT_AMBULATORY_CARE_PROVIDER_SITE_OTHER): Payer: 59 | Admitting: Family Medicine

## 2017-06-18 ENCOUNTER — Encounter: Payer: Self-pay | Admitting: Family Medicine

## 2017-06-18 DIAGNOSIS — M5412 Radiculopathy, cervical region: Secondary | ICD-10-CM

## 2017-06-18 DIAGNOSIS — M1A30X0 Chronic gout due to renal impairment, unspecified site, without tophus (tophi): Secondary | ICD-10-CM

## 2017-06-18 DIAGNOSIS — M109 Gout, unspecified: Secondary | ICD-10-CM | POA: Insufficient documentation

## 2017-06-18 NOTE — Assessment & Plan Note (Signed)
Could be contributing to some of the arthritic pain.  Could also show Patient has had significant arthritic changes.  Patient will consider possibly allopurinol but wants to discuss with his other physicians.  Encouraged him to continue the tart cherry extract

## 2017-06-18 NOTE — Assessment & Plan Note (Signed)
No radicular symptoms at the follow-up.  Encourage patient to continue the low-dose of the gabapentin.  Discussed icing regimen.  Doing well but with the conservative therapy.  Patient wants to hold on any osteopathic manipulation at this time.  Patient also declined any formal physical therapy.  Discussed icing regimen.  Follow-up with me again in 2 months.

## 2017-06-18 NOTE — Patient Instructions (Signed)
Great to see you  I am so happy we are doing good Continue the gabapentin  Lets avoid ibuprofen for sure I still think gout is playing a role. Ask about 200mg  of allopurinol .  You  Started at 8.9 uric acid and down to 7.5mg  on tart cherry extract.  Still need you below 6 and think you will feel better Continue with the exercises 2-3 times a week  Keep hands within peripheral vision.  See me again in 8 weeks but send a message after talking to your kidney doc.

## 2017-06-28 ENCOUNTER — Ambulatory Visit: Payer: Self-pay | Admitting: Family Medicine

## 2017-08-11 NOTE — Progress Notes (Signed)
[name] [place], [place] [address] Phone: [phone] Subjective:    I'm seeing this patient by the request  of:    CC: Neck and back pain follow-up  [id number]  Bryan Walls is a 53 y.o. male coming in with complaint of neck and back pain.  Found to have moderate to severe arthritis.  Found to have gout and was to ask primary care provider about allopurinol.  Has had decreased with patient taking over-the-counter supplementation.  Patient states that the gabapentin has been managing his pain. Patient would like to know what the next step is going to be as he would like to not be dependent on gabapentin forever.     Past Medical History:  Diagnosis Date  . 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 LUMBAR FUSION     Social History   Socioeconomic History  . Marital status: Married    Spouse name: Not on file  . Number of children: Not on file  . Years of education: Not on file  . Highest education level: Not on file  Occupational History  . Not on file  Social Needs  . Financial resource strain: Not on file  . Food insecurity:    Worry: Not on file    Inability: Not on file  . Transportation needs:    Medical: Not on file    Non-medical: Not on file  Tobacco Use  . Smoking status: Never Smoker  . Smokeless tobacco: Never Used  Substance and Sexual Activity  . Alcohol use: No  . Drug use: No  . Sexual activity: Not on file  Lifestyle  . Physical activity:    Days per week: Not on file    Minutes per session: Not on file  . Stress: Not on file  Relationships  . Social connections:    Talks on phone: Not on file    Gets together: Not on file    Attends religious service: Not on file    Active member of club or organization: Not on file    Attends meetings of clubs or organizations: Not on file    Relationship status: Not on file  Other Topics Concern  .  Not on file  Social History Narrative  . Not on file   No Known Allergies Family History  Problem Relation Age of Onset  . Cancer Mother        cervical  . Diabetes Mother   . Hypertension Father      Past medical history, social, surgical and family history all reviewed in electronic medical record.  No pertanent information unless stated regarding to the chief complaint.   Review of Systems:Review of systems updated and as accurate as of 08/13/17  No headache, visual changes, nausea, vomiting, diarrhea, constipation, dizziness, abdominal pain, skin rash, fevers, chills, night sweats, weight loss, swollen lymph nodes, body aches, joint swelling,  chest pain, shortness of breath, mood changes.  Positive muscle aches  Objective  Blood pressure 126/82, pulse 77, height 5\' 11"  (1.803 m), weight 198 lb (89.8 kg), SpO2 98 %. Systems examined below as of 08/13/17   General: No apparent distress alert and oriented x3 mood and affect normal, dressed appropriately.  HEENT: Pupils equal, extraocular movements intact  Respiratory: Patient's speak in full sentences and does not appear short of breath  Cardiovascular: No lower extremity edema, non tender, no erythema  Skin: Warm dry intact with no signs of infection or rash on extremities or on axial skeleton.  Abdomen: Soft nontender  Neuro: Cranial nerves II through XII are intact, neurovascularly intact in all extremities with 2+ DTRs and 2+ pulses.  Lymph: No lymphadenopathy of posterior or anterior cervical chain or axillae bilaterally.  Gait normal with good balance and coordination.  MSK:  Non tender with full range of motion and good stability and symmetric strength and tone of shoulders, elbows, wrist, hip, knee and ankles bilaterally.  Back Exam:  Inspection: Unremarkable  Motion: Flexion 45 deg, Extension 25 deg, Side Bending to 35 deg bilaterally,  Rotation to 45 deg bilaterally  SLR laying: Negative  XSLR laying: Negative    Palpable tenderness: Tender to palpation the paraspinal musculature lumbar spine right greater than left. FABER: Positive right. Sensory change: Gross sensation intact to all lumbar and sacral dermatomes.  Reflexes: 2+ at both patellar tendons, 2+ at achilles tendons, Babinski's downgoing.  Strength at foot  Plantar-flexion: 5/5 Dorsi-flexion: 5/5 Eversion: 5/5 Inversion: 5/5  Leg strength  Quad: 5/5 Hamstring: 5/5 Hip flexor: 5/5 Hip abductors: 5/5  Gait unremarkable.  Osteopathic findings  C2 flexed rotated and side bent right C4 flexed rotated and side bent left C7 flexed rotated and side bent left T9 extended rotated and side bent left L1 flexed rotated and side bent right Sacrum right on right     Impression and Recommendations:     This case required medical decision making of moderate complexity.      Note: This dictation was prepared with Dragon dictation along with smaller phrase technology. Any transcriptional errors that result from this process are unintentional.

## 2017-08-13 ENCOUNTER — Encounter: Payer: Self-pay | Admitting: Family Medicine

## 2017-08-13 ENCOUNTER — Ambulatory Visit: Payer: 59 | Admitting: Family Medicine

## 2017-08-13 DIAGNOSIS — M999 Biomechanical lesion, unspecified: Secondary | ICD-10-CM | POA: Diagnosis not present

## 2017-08-13 DIAGNOSIS — S39012A Strain of muscle, fascia and tendon of lower back, initial encounter: Secondary | ICD-10-CM | POA: Diagnosis not present
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
## 2017-08-13 NOTE — Patient Instructions (Signed)
Good to see you  Alvera Singh is your friend.  Gabapentin 0-3 pills at night I think you are doing great  Started manipulation  See me again in 6 weeks

## 2017-08-13 NOTE — Assessment & Plan Note (Signed)
Decision today to treat with OMT was based on Physical Exam  After verbal consent patient was treated with HVLA, ME, FPR techniques in cervical, thoracic, lumbar and sacral areas  Patient tolerated the procedure well with improvement in symptoms  Patient given exercises, stretches and lifestyle modifications  See medications in patient instructions if given  Patient will follow up in 4-6 weeks 

## 2017-09-23 NOTE — Progress Notes (Signed)
[name] [place], [place] [address] Phone: [phone] Subjective:   CC:  Neck and back pain   [id number]  Bryan Walls is a 52 y.o. male coming in with complaint of back and neck pain. He has been doing well since last visit and is not having any notable pain at this time. He is here for OMT.  Patient has been feeling good overall.  States that usually normally 95% himself or better works days of the week.      Past Medical History:  Diagnosis Date  . 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 LUMBAR FUSION     Social History   Socioeconomic History  . Marital status: Married    Spouse name: Not on file  . Number of children: Not on file  . Years of education: Not on file  . Highest education level: Not on file  Occupational History  . Not on file  Social Needs  . Financial resource strain: Not on file  . Food insecurity:    Worry: Not on file    Inability: Not on file  . Transportation needs:    Medical: Not on file    Non-medical: Not on file  Tobacco Use  . Smoking status: Never Smoker  . Smokeless tobacco: Never Used  Substance and Sexual Activity  . Alcohol use: No  . Drug use: No  . Sexual activity: Not on file  Lifestyle  . Physical activity:    Days per week: Not on file    Minutes per session: Not on file  . Stress: Not on file  Relationships  . Social connections:    Talks on phone: Not on file    Gets together: Not on file    Attends religious service: Not on file    Active member of club or organization: Not on file    Attends meetings of clubs or organizations: Not on file    Relationship status: Not on file  Other Topics Concern  . Not on file  Social History Narrative  . Not on file   No Known Allergies Family History  Problem Relation Age of Onset  . Cancer Mother        cervical  . Diabetes Mother   . Hypertension Father       Past medical history, social, surgical and family history all reviewed in electronic medical record.  No pertanent information unless stated regarding to the chief complaint.   Review of Systems:Review of systems updated and as accurate as of 09/24/17  No headache, visual changes, nausea, vomiting, diarrhea, constipation, dizziness, abdominal pain, skin rash, fevers, chills, night sweats, weight loss, swollen lymph nodes, body aches, joint swelling, muscle aches, chest pain, shortness of breath, mood changes.   Objective  Blood pressure 112/80, pulse 77, height 5\' 11"  (1.803 m), weight 194 lb (88 kg), SpO2 98 %. Systems examined below as of 09/24/17   General: No apparent distress alert and oriented x3 mood and affect normal, dressed appropriately.  HEENT: Pupils equal, extraocular movements intact  Respiratory: Patient's speak in full sentences and does not appear short of breath  Cardiovascular: No lower extremity edema, non tender, no erythema  Skin: Warm dry intact with no signs of infection or rash on extremities or on axial skeleton.  Abdomen: Soft nontender  Neuro: Cranial nerves II through XII are intact, neurovascularly intact in all  extremities with 2+ DTRs and 2+ pulses.  Lymph: No lymphadenopathy of posterior or anterior cervical chain or axillae bilaterally.  Gait normal with good balance and coordination.  MSK:  Non tender with full range of motion and good stability and symmetric strength and tone of shoulders, elbows, wrist, hip, knee and ankles bilaterally.  Neck: Inspection mild loss of lordosis. No palpable stepoffs. Negative Spurling's maneuver. Neck shows some decreased range of motion in multiple planes. Grip strength and sensation normal in bilateral hands Strength good C4 to T1 distribution No sensory change to C4 to T1 Negative Hoffman sign bilaterally Reflexes normal  Osteopathic findings C2 flexed rotated and side bent right C4 flexed rotated and  side bent left C6 flexed rotated and side bent left T9 extended rotated and side bent left L2 flexed rotated and side bent right Sacrum right on right    Impression and Recommendations:     This case required medical decision making of moderate complexity.      Note: This dictation was prepared with Dragon dictation along with smaller phrase technology. Any transcriptional errors that result from this process are unintentional.

## 2017-09-24 ENCOUNTER — Encounter: Payer: Self-pay | Admitting: Family Medicine

## 2017-09-24 ENCOUNTER — Ambulatory Visit (INDEPENDENT_AMBULATORY_CARE_PROVIDER_SITE_OTHER): Payer: 59 | Admitting: Family Medicine

## 2017-09-24 VITALS — BP 112/80 | HR 77 | Ht 71.0 in | Wt 194.0 lb

## 2017-09-24 DIAGNOSIS — M999 Biomechanical lesion, unspecified: Secondary | ICD-10-CM | POA: Diagnosis not present

## 2017-09-24 DIAGNOSIS — M5412 Radiculopathy, cervical region: Secondary | ICD-10-CM

## 2017-09-24 NOTE — Assessment & Plan Note (Signed)
Decision today to treat with OMT was based on Physical Exam  After verbal consent patient was treated with HVLA, ME, FPR techniques in cervical, thoracic, lumbar and sacral areas  Patient tolerated the procedure well with improvement in symptoms  Patient given exercises, stretches and lifestyle modifications  See medications in patient instructions if given  Patient will follow up in 1-2 weeks 

## 2017-09-24 NOTE — Patient Instructions (Signed)
Good to see you See me again in 3 months 

## 2017-09-24 NOTE — Assessment & Plan Note (Signed)
No radicular symptoms at this time.  Discussed posture and ergonomics.  Patient is done remarkably well.  As long as patient continues with conservative therapy follow-up in 3 to 4 months

## 2017-12-24 NOTE — Progress Notes (Signed)
[name] [place], [place] [address] Phone: [phone] Subjective:    I'm seeing this patient by the request  of:    CC: Back pain follow-up  [id number]  Bryan Walls is a 52 y.o. male coming in with complaint of cervical and lumbar spine pain. Has been using biofreeze and a new TENS unit to manage his pain. Patient has been able to do exercises for lumbar spine as well with greater ease.   Patient feels like he is near his baseline at this point.  Feels like he is able to do much more activities on a regular basis.  Denies any radiation down the legs any numbness or tingling     Past Medical History:  Diagnosis Date  . 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 LUMBAR FUSION     Social History   Socioeconomic History  . Marital status: Married    Spouse name: Not on file  . Number of children: Not on file  . Years of education: Not on file  . Highest education level: Not on file  Occupational History  . Not on file  Social Needs  . Financial resource strain: Not on file  . Food insecurity:    Worry: Not on file    Inability: Not on file  . Transportation needs:    Medical: Not on file    Non-medical: Not on file  Tobacco Use  . Smoking status: Never Smoker  . Smokeless tobacco: Never Used  Substance and Sexual Activity  . Alcohol use: No  . Drug use: No  . Sexual activity: Not on file  Lifestyle  . Physical activity:    Days per week: Not on file    Minutes per session: Not on file  . Stress: Not on file  Relationships  . Social connections:    Talks on phone: Not on file    Gets together: Not on file    Attends religious service: Not on file    Active member of club or organization: Not on file    Attends meetings of clubs or organizations: Not on file    Relationship status: Not on file  Other Topics Concern  . Not on file  Social History  Narrative  . Not on file   No Known Allergies Family History  Problem Relation Age of Onset  . Cancer Mother        cervical  . Diabetes Mother   . Hypertension Father      Past medical history, social, surgical and family history all reviewed in electronic medical record.  No pertanent information unless stated regarding to the chief complaint.   Review of Systems:Review of systems updated and as accurate as of 12/25/17  No headache, visual changes, nausea, vomiting, diarrhea, constipation, dizziness, abdominal pain, skin rash, fevers, chills, night sweats, weight loss, swollen lymph nodes, body aches, joint swelling, chest pain, shortness of breath, mood changes.   Objective  Blood pressure 118/78, pulse 62, height 5\' 11"  (1.803 m), weight 194 lb (88 kg), SpO2 98 %. Systems examined below as of 12/25/17   General: No apparent distress alert and oriented x3 mood and affect normal, dressed appropriately.  HEENT: Pupils equal, extraocular movements intact  Respiratory: Patient's speak in full sentences and does not appear short of breath  Cardiovascular: No lower extremity edema, non tender, no erythema  Skin: Warm dry  intact with no signs of infection or rash on extremities or on axial skeleton.  Abdomen: Soft nontender  Neuro: Cranial nerves II through XII are intact, neurovascularly intact in all extremities with 2+ DTRs and 2+ pulses.  Lymph: No lymphadenopathy of posterior or anterior cervical chain or axillae bilaterally.  Gait normal with good balance and coordination.  MSK:  Non tender with full range of motion and good stability and symmetric strength and tone of shoulders, elbows, wrist, hip, knee and ankles bilaterally.  Back Exam:  Inspection: Mild loss of lordosis Motion: Flexion 45 deg, Extension 25 deg, Side Bending to 35 deg bilaterally, Rotation to 35 deg bilaterally  SLR laying: Negative  XSLR laying: Negative  Palpable tenderness: Tender to palpation the  paraspinal musculature lumbar spine right greater than left. FABER: Tightness bilaterally. Sensory change: Gross sensation intact to all lumbar and sacral dermatomes.  Reflexes: 2+ at both patellar tendons, 2+ at achilles tendons, Babinski's downgoing.  Strength at foot  Plantar-flexion: 5/5 Dorsi-flexion: 5/5 Eversion: 5/5 Inversion: 5/5  Leg strength  Quad: 5/5 Hamstring: 5/5 Hip flexor: 5/5 Hip abductors: 5/5  Gait unremarkable.  Neck: Inspection unremarkable. No palpable stepoffs. Negative Spurling's maneuver. Full neck range of motion Grip strength and sensation normal in bilateral hands Strength good C4 to T1 distribution No sensory change to C4 to T1 Negative Hoffman sign bilaterally Reflexes normal    Osteopathic findings C2 flexed rotated and side bent right C4 flexed rotated and side bent left C7 flexed rotated and side bent left T3 extended rotated and side bent right inhaled third rib T6 extended rotated and side bent left L2 flexed rotated and side bent right Sacrum left on left  Impression and Recommendations:     This case required medical decision making of moderate complexity.      Note: This dictation was prepared with Dragon dictation along with smaller phrase technology. Any transcriptional errors that result from this process are unintentional.

## 2017-12-25 ENCOUNTER — Encounter: Payer: Self-pay | Admitting: Family Medicine

## 2017-12-25 ENCOUNTER — Ambulatory Visit: Payer: 59 | Admitting: Family Medicine

## 2017-12-25 VITALS — BP 118/78 | HR 62 | Ht 71.0 in | Wt 194.0 lb

## 2017-12-25 DIAGNOSIS — M999 Biomechanical lesion, unspecified: Secondary | ICD-10-CM | POA: Diagnosis not present

## 2017-12-25 DIAGNOSIS — M5412 Radiculopathy, cervical region: Secondary | ICD-10-CM

## 2017-12-25 NOTE — Patient Instructions (Signed)
Good to see you  Bryan Walls is your friend Stay active You know where I am if you need me Call [phone] when you need [place]  See me again when you need me

## 2017-12-25 NOTE — Assessment & Plan Note (Signed)
No radicular symptoms.  No radicular symptoms.  Patient is taking the gabapentin very intermittently at this point.  Patient is taking vitamin D supplementation, discussed icing regimen and home exercises.  Patient will follow-up with me again more on an as-needed basis.

## 2017-12-25 NOTE — Assessment & Plan Note (Signed)
Decision today to treat with OMT was based on Physical Exam  After verbal consent patient was treated with HVLA, ME, FPR techniques in cervical, thoracic, lumbar and sacral areas  Patient tolerated the procedure well with improvement in symptoms  Patient given exercises, stretches and lifestyle modifications  See medications in patient instructions if given  Patient will follow up as needed

## 2018-12-05 ENCOUNTER — Ambulatory Visit: Payer: 59 | Admitting: Physical Therapy

## 2019-01-06 ENCOUNTER — Encounter: Payer: Self-pay | Admitting: Rehabilitative and Restorative Service Providers"

## 2019-01-06 ENCOUNTER — Other Ambulatory Visit: Payer: Self-pay

## 2019-01-06 ENCOUNTER — Ambulatory Visit
Payer: No Typology Code available for payment source | Attending: Family Medicine | Admitting: Rehabilitative and Restorative Service Providers"

## 2019-01-06 DIAGNOSIS — R42 Dizziness and giddiness: Secondary | ICD-10-CM | POA: Diagnosis present

## 2019-01-06 NOTE — Patient Instructions (Signed)
Access Code: [email or web address]  URL: [email or web address]  Date: 01/06/2019  Prepared by: Rudell Cobb   Exercises Standing Gaze Stabilization with Head Rotation - 1 reps - 2 sets - 1 minute hold - 3x daily - 7x weekly Romberg Stance Eyes Closed on Foam Pad - 3 reps - 1 sets - 30 seconds hold - 2x daily - 7x weekly

## 2019-01-06 NOTE — Therapy (Signed)
[facility] [address] [place], [place], [address] Phone: [phone]   Fax:  [phone]  Physical Therapy Evaluation  Patient Details  Name: Bryan Walls MRN: [id number] Date of Birth: 12/23/1965 No data recorded  Encounter Date: 01/06/2019  PT End of Session - 01/06/19 2216    Visit Number  1    Number of Visits  4    Date for PT Re-Evaluation  02/20/19    Authorization Type  VA approved 15 visits    PT Start Time  1408    PT Stop Time  1448    PT Time Calculation (min)  40 min    Activity Tolerance  Patient tolerated treatment well    Behavior During Therapy  [facility] for tasks assessed/performed       Past Medical History:  Diagnosis Date  . Syndrome X, cardiac [facility])    history    Past Surgical History:  Procedure Laterality Date  . LUMBAR FUSION      There were no vitals filed for this visit.   Subjective Assessment - 01/06/19 1413    Subjective  The patient reports a h/o episodic vertigo since he was a teenager.  He reports it went dormant for a long time when he moved here and then returned.  The patient describes 3 different types of symptoms:  1) positional type symptoms worse with getting into and out of bed described as room spinning  2) a sensation of constant spinning in which he can only stop symptoms when he lays stationery in the bed 3) a sensation of sensitivity to noise, light, and sound.  He describes the episodes as lasting anywhere from 1 day to weeks.  He denies h/o vision changes, migraines or hearing changes.  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    Pertinent History  documented 50% weakness R side    Patient Stated Goals  Reduce the frequency of episodes of dizziness.  Get a handle on the cycle of vertigo.    Currently in Pain?  No/denies         [facility] PT Assessment - 01/06/19 1422      Assessment   Medical Diagnosis  R vestibular hypofunction, R BPPV     Prior Therapy  yes, Epley's maneuver      Precautions   Precautions  None      Restrictions   Weight Bearing Restrictions  No      Balance Screen   Has the patient fallen in the past 6 months  No    Has the patient had a decrease in activity level because of a fear of falling?   Yes   during episodes   Is the patient reluctant to leave their home because of a fear of falling?   No      Home Environment   Living Environment  Private residence      Prior Function   Level of Independence  Independent    Vocation  Full time employment           Vestibular Assessment - 01/06/19 1422      Vestibular Assessment   General Observation  He does have some vestibular migraine type symptoms including nausea with sounds, lights, and smells during episodes.      Symptom Behavior   Subjective history of current problem  Intermittent, episodic spells of dizziness    Type of Dizziness   Spinning    Frequency of  Dizziness  intermittently    Duration of Dizziness  constant during bad spells, also describes movement provoked symptoms that lasts for shorter durations    Symptom Nature  Motion provoked    Aggravating Factors  Activity in general    Relieving Factors  Head stationary;Lying supine    Progression of Symptoms  Better    History of similar episodes  Since being a teenager      Oculomotor Exam   Oculomotor Alignment  Normal    Ocular ROM  wfls    Spontaneous  Absent    Gaze-induced   Absent    Smooth Pursuits  Intact    Saccades  Intact      Vestibulo-Ocular Reflex   VOR 1 Head Only (x 1 viewing)  x 10 reps= 2/10 dizziness    Comment  Head impulse test=positive for refixation saccade bilaterally to target      Visual Acuity   Static  line 10    Dynamic  line 3   misses 1 letter in line 4; notes visual blurring     Positional Testing   Dix-Hallpike  Dix-Hallpike Right;Dix-Hallpike Left    Horizontal Canal Testing  Horizontal Canal Right;Horizontal Canal Left       Dix-Hallpike Right   Dix-Hallpike Right Duration  0    Dix-Hallpike Right Symptoms  No nystagmus      Dix-Hallpike Left   Dix-Hallpike Left Duration  0    Dix-Hallpike Left Symptoms  No nystagmus      Horizontal Canal Right   Horizontal Canal Right Duration  0    Horizontal Canal Right Symptoms  Normal      Horizontal Canal Left   Horizontal Canal Left Duration  0    Horizontal Canal Left Symptoms  Normal          Objective measurements completed on examination: See above findings.      [facility] Adult PT Treatment/Exercise - 01/06/19 2215      Neuro Re-ed    Neuro Re-ed Details   Corner balance standing on foam with eyes closed narrowing base of support.      Vestibular Treatment/Exercise - 01/06/19 1433      Vestibular Treatment/Exercise   Vestibular Treatment Provided  Habituation;Gaze    Gaze Exercises  X1 Viewing Horizontal;X1 Viewing Vertical      X1 Viewing Horizontal   Foot Position  standing feet apart    Comments  30 seconds with cues to increase speed and maintain visual fixation to target      X1 Viewing Vertical   Foot Position  feet apart    Comments  x 30 seconds; no provoking symptoms            PT Education - 01/06/19 2215    Education Details  gaze adaptation established and high level vestibular balance exercise.    Person(s) Educated  Patient    Methods  Explanation;Demonstration;Handout    Comprehension  Returned demonstration;Verbalized understanding          PT Long Term Goals - 01/06/19 2232      PT LONG TERM GOAL #1   Title  The patient will return demo HEP for gaze adaptation and high level balance activities.    Time  4    Period  Weeks    Target Date  02/20/19      PT LONG TERM GOAL #2   Title  The patient will improve static versus dynamic visual acuity from 7 line difference to 5  line difference to demo improving gaze adaptation.    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  He has diminished VOR per 7 line difference on SVA versus DVA and positive head impulse testing.  PT initited HEP for gaze adaptation.and high level balance activities to emphasize use of vestibular inputs.    Stability/Clinical Decision Making  Stable/Uncomplicated    Clinical Decision Making  Low    Rehab Potential  Good    PT Frequency  1x / week    PT Duration  4 weeks    PT Treatment/Interventions  ADLs/Self Care Home Management;Canalith Repostioning;Vestibular;Balance training;Neuromuscular re-education;Patient/family education;Gait training    PT Next Visit Plan  Check gaze and progress to 2 minutes and narrowing base of support; increase HEP difficulty for vestibular inputs during balance.    Consulted and Agree with Plan of Care  Patient       Patient will benefit from skilled therapeutic intervention in order to improve the following deficits and impairments:  Decreased balance, Dizziness  Visit Diagnosis: Dizziness and giddiness     Problem List Patient Active Problem List   Diagnosis Date Noted  . Nonallopathic lesion of cervical region 08/13/2017  . Nonallopathic lesion of thoracic region 08/13/2017  . Nonallopathic lesion of sacral region 08/13/2017  . Nonallopathic lesion of lumbosacral region 08/13/2017  . Gout 06/18/2017  . Cervical radiculopathy at C8 05/21/2017  . Cervical pain (neck) 03/26/2017  . Lumbosacral strain 02/09/2017  . Multiple thyroid nodules 12/16/2015  . Synovitis of finger 06/15/2015  . Vitamin D deficiency 01/28/2010  . LOW BACK PAIN, CHRONIC 08/17/2009  . UNSPECIFIED ADJUSTMENT REACTION 02/05/2008    Bram Hottel, PT 01/06/2019, 10:54 PM  [facility] [address] [place], [place],  [address] Phone: [phone]   Fax:  [phone]  Name: SANCHEZ PEEBLES MRN: [id number] Date of Birth: 12/24/1965

## 2019-01-24 ENCOUNTER — Ambulatory Visit: Payer: No Typology Code available for payment source | Admitting: Rehabilitative and Restorative Service Providers"

## 2019-01-24 ENCOUNTER — Encounter

## 2019-02-06 ENCOUNTER — Other Ambulatory Visit: Payer: Self-pay

## 2019-02-06 ENCOUNTER — Encounter: Payer: Self-pay | Admitting: Rehabilitative and Restorative Service Providers"

## 2019-02-06 ENCOUNTER — Ambulatory Visit
Payer: No Typology Code available for payment source | Attending: Family Medicine | Admitting: Rehabilitative and Restorative Service Providers"

## 2019-02-06 DIAGNOSIS — R42 Dizziness and giddiness: Secondary | ICD-10-CM | POA: Diagnosis present

## 2019-02-06 DIAGNOSIS — R29898 Other symptoms and signs involving the musculoskeletal system: Secondary | ICD-10-CM | POA: Insufficient documentation

## 2019-02-06 DIAGNOSIS — M542 Cervicalgia: Secondary | ICD-10-CM | POA: Diagnosis present

## 2019-02-06 NOTE — Therapy (Addendum)
[name] [address] [place], [place], [address] Phone: [phone]   Fax:  [phone]  Physical Therapy Treatment and Discharge Summary  Patient Details  Name: Bryan Walls MRN: [id number] Date of Birth: 1966/03/15 No data recorded  Encounter Date: 02/06/2019  PT End of Session - 02/06/19 1322    Visit Number  2    Number of Visits  4    Date for PT Re-Evaluation  02/20/19    Authorization Type  VA approved 15 visits    PT Start Time  1320    PT Stop Time  1400    PT Time Calculation (min)  40 min    Activity Tolerance  Patient tolerated treatment well    Behavior During Therapy  [facility] for tasks assessed/performed       Past Medical History:  Diagnosis Date  . Syndrome X, cardiac [facility])    history    Past Surgical History:  Procedure Laterality Date  . LUMBAR FUSION      There were no vitals filed for this visit.  Subjective Assessment - 02/06/19 1322    Subjective  The patient reports he has not done a lot of exercise due to increased workload and having neck pain.    Pertinent History  documented 50% weakness R side    Patient Stated Goals  Reduce the frequency of episodes of dizziness.  Get a handle on the cycle of vertigo.                       [facility] Adult PT Treatment/Exercise - 02/06/19 2203      Self-Care   Self-Care  Other Self-Care Comments    Other Self-Care Comments   Discussed patient's current status.  He is not experiencing any dizziness and is able to participate in basketball and recreational tasks without limitations.      Neuro Re-ed    Neuro Re-ed Details   Reviewed gaze adaptation x 1 viewing without dizziness.  Performed corner balance exercises with feet together and eyes closed.    Dynamic gait with tennis ball toss ; no difficulty or loss of balance.                  PT Long Term Goals - 02/06/19 2148      PT LONG TERM GOAL #1   Title  The  patient will return demo HEP for gaze adaptation and high level balance activities.    Baseline  Patient has HEP, but has not performed regularly due to no symptoms.    Time  4    Period  Weeks    Status  Achieved      PT LONG TERM GOAL #2   Title  The patient will improve static versus dynamic visual acuity from 7 line difference to 5 line difference to demo improving gaze adaptation.    Baseline  did not retest    Time  6    Period  Weeks    Status  Deferred            Plan - 02/06/19 2214    Clinical Impression Statement  The patient reports no limitations in functional status at this time.  He has not experienced vertigo since initial evaluation a month ago.  PT to hold current treatment as patient notes he would like to return if another episode occurs.    PT Treatment/Interventions  ADLs/Self Care Home Management;Canalith Repostioning;Vestibular;Balance training;Neuromuscular re-education;Patient/family  education;Gait training    PT Next Visit Plan  On hold due to no current symptoms of vertigo;  PT to reassess if episode occurs.    Consulted and Agree with Plan of Care  Patient       Patient will benefit from skilled therapeutic intervention in order to improve the following deficits and impairments:  Decreased balance, Dizziness  Visit Diagnosis: Other symptoms and signs involving the musculoskeletal system  Dizziness and giddiness  Cervicalgia     Problem List Patient Active Problem List   Diagnosis Date Noted  . Nonallopathic lesion of cervical region 08/13/2017  . Nonallopathic lesion of thoracic region 08/13/2017  . Nonallopathic lesion of sacral region 08/13/2017  . Nonallopathic lesion of lumbosacral region 08/13/2017  . Gout 06/18/2017  . Cervical radiculopathy at C8 05/21/2017  . Cervical pain (neck) 03/26/2017  . Lumbosacral strain 02/09/2017  . Multiple thyroid nodules 12/16/2015  . Synovitis of finger 06/15/2015  . Vitamin D deficiency  01/28/2010  . LOW BACK PAIN, CHRONIC 08/17/2009  . UNSPECIFIED ADJUSTMENT REACTION 02/05/2008     Patient did not return to PT.  Please refer to initial evaluation for patient status. Thank you for the referral of this patient. Rudell Cobb, MPT    [place], [place] 02/06/2019, 10:18 PM  [facility] [address] [place], [place], [address] Phone: [phone]   Fax:  [phone]  Name: Bryan Walls MRN: [id number] Date of Birth: [birth year]

## 2019-12-09 ENCOUNTER — Ambulatory Visit (INDEPENDENT_AMBULATORY_CARE_PROVIDER_SITE_OTHER): Payer: No Typology Code available for payment source | Admitting: Rehabilitative and Restorative Service Providers"

## 2019-12-09 ENCOUNTER — Other Ambulatory Visit: Payer: Self-pay

## 2019-12-09 DIAGNOSIS — H8111 Benign paroxysmal vertigo, right ear: Secondary | ICD-10-CM

## 2019-12-09 DIAGNOSIS — R42 Dizziness and giddiness: Secondary | ICD-10-CM | POA: Diagnosis not present

## 2019-12-09 NOTE — Therapy (Signed)
[facility] [place], [place], [address] Phone: [phone]   Fax:  [phone]  Physical Therapy Evaluation  Patient Details  Name: Bryan Walls MRN: [id number] Date of Birth: 06-14-1965 Referring Provider (PT): Delorise Shiner, MD   Encounter Date: 12/09/2019   PT End of Session - 12/09/19 1126    Visit Number 1    Number of Visits 6    Date for PT Re-Evaluation 01/20/20    Authorization Type VA authorization x 15 visits    Authorization - Visit Number 1    Authorization - Number of Visits 15    PT Start Time 1101    PT Stop Time 1140    PT Time Calculation (min) 39 min    Activity Tolerance Patient tolerated treatment well    Behavior During Therapy [facility] for tasks assessed/performed           Past Medical History:  Diagnosis Date  . Syndrome X, cardiac [facility])    history    Past Surgical History:  Procedure Laterality Date  . LUMBAR FUSION      There were no vitals filed for this visit.    Subjective Assessment - 12/09/19 1101    Subjective The patient presents to OP physical therapy reporting mild episodes of dizziness worse in the morning when getting up, and then every night with getting into bed.  Symptoms last for seconds up to a minute.  On occasion, he gets some dizziness in sitting that is spontaneous in nature.    Pertinent History back pain, neck pain, 11/2018-- documented 50% R vestibular weakness    Patient Stated Goals get rid of vertigo    Currently in Pain? Yes    Effect of Pain on Daily Activities patient has chronic neck and low back pain *PT to monitor, no goal to follow due to nature of referral              [facility] PT Assessment - 12/09/19 1110      Assessment   Medical Diagnosis Vertigo    Referring Provider (PT) Delorise Shiner, MD    Onset Date/Surgical Date 11/25/19   symptoms worse in April May   Hand Dominance Right    Prior Therapy known to our clinic for prior  MSK issues and vertigo      Balance Screen   Has the patient fallen in the past 6 months No    Has the patient had a decrease in activity level because of a fear of falling?  No    Is the patient reluctant to leave their home because of a fear of falling?  No      Home Environment   Living Environment Private residence    Living Arrangements Spouse/significant other;Children      Prior Function   Level of Independence Independent      Observation/Other Assessments   Focus on Therapeutic Outcomes (FOTO)  ADD                  Vestibular Assessment - 12/09/19 1112      Symptom Behavior   Subjective history of current problem Patient has many year h/o intermittent vertigo.  He has different types of episodes.  He has some constant dizziness in the past with light and noise sensitivity.  He currently is having more positional symptoms.    Type of Dizziness  Spinning    Frequency of Dizziness daily    Duration of Dizziness seconds  to up to a minute    Symptom Nature Motion provoked;Positional;Spontaneous    Aggravating Factors Activity in general    Relieving Factors Head stationary;Lying supine    Progression of Symptoms Worse      Oculomotor Exam   Oculomotor Alignment Normal    Ocular ROM WFLs    Spontaneous Absent    Gaze-induced  Absent    Smooth Pursuits Intact    Saccades Intact      Vestibulo-Ocular Reflex   VOR 1 Head Only (x 1 viewing) self regulated pace x 10 reps provokes 1/10 with ability to fixate gaze    VOR Cancellation Normal    Comment head impulse test=positive for R refixation saccade to target      Positional Testing   Dix-Hallpike Dix-Hallpike Right;Dix-Hallpike Left    Horizontal Canal Testing Horizontal Canal Right;Horizontal Canal Left      Dix-Hallpike Right   Dix-Hallpike Right Duration 10seconds, 8 second latency    Dix-Hallpike Right Symptoms Upbeat, right rotatory nystagmus      Dix-Hallpike Left   Dix-Hallpike Left Duration none     Dix-Hallpike Left Symptoms No nystagmus      Horizontal Canal Right   Horizontal Canal Right Duration none    Horizontal Canal Right Symptoms Normal      Horizontal Canal Left   Horizontal Canal Left Duration none    Horizontal Canal Left Symptoms Normal              Objective measurements completed on examination: See above findings.        Vestibular Treatment/Exercise - 12/09/19 1124      Vestibular Treatment/Exercise   Vestibular Treatment Provided Canalith Repositioning;Habituation;Gaze    Canalith Repositioning Epley Manuever Right    Habituation Exercises [employer]    Gaze Exercises X1 Viewing Horizontal       EPLEY MANUEVER RIGHT   Number of Reps  2    Overall Response Symptoms Resolved    Response Details  no nystagmus second repetition      Nestor Lewandowsky   Symptom Description  *provided handout and education to begin on n8/5 if positional symptoms remain      X1 Viewing Horizontal   Foot Position seated gaze    Comments 10 reps                 PT Education - 12/09/19 1132    Education Details HEP    Person(s) Educated Patient    Methods Explanation;Demonstration;Handout    Comprehension Returned demonstration;Verbalized understanding               PT Long Term Goals - 12/09/19 1137      PT LONG TERM GOAL #1   Title The patient will return demo HEP for self mgmt positional vertigo, VOR and gaze adaptation.    Time 6    Period Weeks    Target Date 01/20/20      PT LONG TERM GOAL #2   Title The patient will tolerate gaze x 1 adaptation x 60 seconds.    Time 6    Period Weeks    Target Date 01/20/20      PT LONG TERM GOAL #3   Title The patient will have negative positional testing R side to indicate resolution of BPPV.    Time 6    Period Weeks    Target Date 01/20/20                  Plan -  12/09/19 1253    Clinical Impression Statement The patient is a 54 yo male presenting to OP physical therapy with h/o  intermittent vertigo with documented R vestibular 50% weakness (11/2018).  He has had worsening positional symptoms in the past few months and has + R dix hallpike testing today.  He responded well to epley's maneuver for R posterior canalithiasis and HEP was initated for VOR gaze adaptation.    Personal Factors and Comorbidities Comorbidity 2;Time since onset of injury/illness/exacerbation    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, h/o chornic neck/back pain    Examination-Activity Limitations Bed Mobility    Stability/Clinical Decision Making Stable/Uncomplicated    Clinical Decision Making Low    Rehab Potential Good    PT Frequency 1x / week    PT Duration 6 weeks    PT Treatment/Interventions ADLs/Self Care Home Management;Canalith Repostioning;Vestibular;Neuromuscular re-education;Balance training;Gait training;Patient/family education    PT Next Visit Plan check R BPPV, progress gaze, add multi-sensory balance challenges to HEP    PT Home Exercise Plan Access Code: [email or web address]    Consulted and Agree with Plan of Care Patient           Patient will benefit from skilled therapeutic intervention in order to improve the following deficits and impairments:  Dizziness, Decreased balance  Visit Diagnosis: BPPV (benign paroxysmal positional vertigo), right  Dizziness and giddiness     Problem List Patient Active Problem List   Diagnosis Date Noted  . Nonallopathic lesion of cervical region 08/13/2017  . Nonallopathic lesion of thoracic region 08/13/2017  . Nonallopathic lesion of sacral region 08/13/2017  . Nonallopathic lesion of lumbosacral region 08/13/2017  . Gout 06/18/2017  . Cervical radiculopathy at C8 05/21/2017  . Cervical pain (neck) 03/26/2017  . Lumbosacral strain 02/09/2017  . Multiple thyroid nodules 12/16/2015  . Synovitis of finger 06/15/2015  . Vitamin D deficiency 01/28/2010  . LOW BACK PAIN, CHRONIC 08/17/2009  . UNSPECIFIED ADJUSTMENT REACTION  02/05/2008    Hyla Coard, PT 12/09/2019, 12:57 PM  [facility] [place], [place], [address] Phone: [phone]   Fax:  [phone]  Name: BRANSEN FASSNACHT MRN: [id number] Date of Birth: 1965-12-10

## 2019-12-09 NOTE — Patient Instructions (Signed)
Access Code: [email or web address] URL: [email or web address] Date: 12/09/2019 Prepared by: Rudell Cobb  Exercises Brandt-Daroff Vestibular Exercise - 2 x daily - 7 x weekly - 1 sets - 5 reps Standing Gaze Stabilization with Head Rotation - 2 x daily - 7 x weekly - 1 sets - 1 reps - 30 seconds hold

## 2019-12-19 ENCOUNTER — Ambulatory Visit (INDEPENDENT_AMBULATORY_CARE_PROVIDER_SITE_OTHER): Payer: No Typology Code available for payment source | Admitting: Rehabilitative and Restorative Service Providers"

## 2019-12-19 ENCOUNTER — Other Ambulatory Visit: Payer: Self-pay

## 2019-12-19 DIAGNOSIS — R29898 Other symptoms and signs involving the musculoskeletal system: Secondary | ICD-10-CM | POA: Diagnosis not present

## 2019-12-19 DIAGNOSIS — R42 Dizziness and giddiness: Secondary | ICD-10-CM | POA: Diagnosis not present

## 2019-12-19 DIAGNOSIS — H8111 Benign paroxysmal vertigo, right ear: Secondary | ICD-10-CM

## 2019-12-19 NOTE — Therapy (Signed)
[place], [place], [address] Phone: [phone]   Fax:  [phone]  Physical Therapy Treatment  Patient Details  Name: Bryan Walls MRN: [id number] Date of Birth: [birth year] Referring Provider (PT): Delorise Shiner, MD   Encounter Date: 12/19/2019   PT End of Session - 12/19/19 1341    Visit Number 2    Number of Visits 6    Date for PT Re-Evaluation 01/20/20    Authorization Type VA authorization x 15 visits    Authorization - Visit Number 2    Authorization - Number of Visits 15    PT Start Time 1322    PT Stop Time 1350    PT Time Calculation (min) 28 min    Activity Tolerance Patient tolerated treatment well    Behavior During Therapy [facility] for tasks assessed/performed           Past Medical History:  Diagnosis Date  . Syndrome X, cardiac [facility])    history    Past Surgical History:  Procedure Laterality Date  . LUMBAR FUSION      There were no vitals filed for this visit.   Subjective Assessment - 12/19/19 1324    Subjective The patient no longer has vertigo with positional movements.  Most of symptoms are in the morning or after laying down and getting up (no matter time of day).    Pertinent History back pain, neck pain, 11/2018-- documented 50% R vestibular weakness    Patient Stated Goals get rid of vertigo    Currently in Pain? No/denies                   Vestibular Assessment - 12/19/19 1326      Dix-Hallpike Right   Dix-Hallpike Right Duration 10 seconds, low amplitude    Dix-Hallpike Right Symptoms Upbeat, right rotatory nystagmus      Orthostatics   BP supine (x 5 minutes) 114/72    HR supine (x 5 minutes) 71    BP standing (after 1 minute) 120/84   dizzy and nauseous with immediate transition to stand   HR standing (after 1 minute) 89    BP standing (after 3 minutes) 121/82    HR standing (after 3 minutes) 91    Orthostatics Comment Notes he only  gets this sensation during dizzy episodes (until symptoms clear)                     Vestibular Treatment/Exercise - 12/19/19 1330      Vestibular Treatment/Exercise   Vestibular Treatment Provided Canalith Repositioning;Gaze;Habituation    Canalith Repositioning Epley Manuever Right    Habituation Exercises Brandt Daroff;360 degree Turns    Gaze Exercises X1 Viewing Horizontal;X1 Viewing Vertical       EPLEY MANUEVER RIGHT   Number of Reps  2    Overall Response Symptoms Resolved    Response Details  no nystagmus the second repetition      Nestor Lewandowsky   Symptom Description  continuing for HEP      360 degree Turns   Number of Reps  3    COMMENT right and left with 4/10 symptoms; provided for HEP      X1 Viewing Horizontal   Foot Position standing gaze    Reps 1    Comments 30 seconds      X1 Viewing Vertical   Foot Position standing gaze    Reps 1    Comments  30 seconds without symptoms                 PT Education - 12/19/19 1341    Education Details HEP    Person(s) Educated Patient    Methods Explanation;Demonstration;Handout    Comprehension Returned demonstration;Verbalized understanding               PT Long Term Goals - 12/09/19 1137      PT LONG TERM GOAL #1   Title The patient will return demo HEP for self mgmt positional vertigo, VOR and gaze adaptation.    Time 6    Period Weeks    Target Date 01/20/20      PT LONG TERM GOAL #2   Title The patient will tolerate gaze x 1 adaptation x 60 seconds.    Time 6    Period Weeks    Target Date 01/20/20      PT LONG TERM GOAL #3   Title The patient will have negative positional testing R side to indicate resolution of BPPV.    Time 6    Period Weeks    Target Date 01/20/20                 Plan - 12/19/19 1356    Clinical Impression Statement The patient continues with low amplitude nystagmus viewed in room light.  Symtpoms clear with Epley's maneuver. Plan to  continue to LTGs.    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, h/o chornic neck/back pain    Stability/Clinical Decision Making Stable/Uncomplicated    Rehab Potential Good    PT Frequency 1x / week    PT Duration 6 weeks    PT Treatment/Interventions ADLs/Self Care Home Management;Canalith Repostioning;Vestibular;Neuromuscular re-education;Balance training;Gait training;Patient/family education    PT Next Visit Plan check R BPPV, progress gaze, add multi-sensory balance challenges to HEP    PT Home Exercise Plan Access Code: [email or web address]    Consulted and Agree with Plan of Care Patient           Patient will benefit from skilled therapeutic intervention in order to improve the following deficits and impairments:  Dizziness, Decreased balance  Visit Diagnosis: BPPV (benign paroxysmal positional vertigo), right  Dizziness and giddiness  Other symptoms and signs involving the musculoskeletal system     Problem List Patient Active Problem List   Diagnosis Date Noted  . Nonallopathic lesion of cervical region 08/13/2017  . Nonallopathic lesion of thoracic region 08/13/2017  . Nonallopathic lesion of sacral region 08/13/2017  . Nonallopathic lesion of lumbosacral region 08/13/2017  . Gout 06/18/2017  . Cervical radiculopathy at C8 05/21/2017  . Cervical pain (neck) 03/26/2017  . Lumbosacral strain 02/09/2017  . Multiple thyroid nodules 12/16/2015  . Synovitis of finger 06/15/2015  . Vitamin D deficiency 01/28/2010  . LOW BACK PAIN, CHRONIC 08/17/2009  . UNSPECIFIED ADJUSTMENT REACTION 02/05/2008    Emilian Stawicki , PT 12/19/2019, 1:56 PM  [facility] [place], [place], [address] Phone: [phone]   Fax:  [phone]  Name: Bryan Walls MRN: [id number] Date of Birth: 07-03-1965

## 2019-12-19 NOTE — Patient Instructions (Signed)
Access Code: [email or web address] URL: [email or web address] Date: 12/19/2019 Prepared by: Rudell Cobb  Exercises Brandt-Daroff Vestibular Exercise - 2 x daily - 7 x weekly - 1 sets - 5 reps Standing Gaze Stabilization with Head Rotation - 2 x daily - 7 x weekly - 1 sets - 1 reps - 30 seconds hold Turning in Corner 360 - 2 x daily - 7 x weekly - 1 sets - 3 reps

## 2019-12-29 ENCOUNTER — Ambulatory Visit (INDEPENDENT_AMBULATORY_CARE_PROVIDER_SITE_OTHER): Payer: No Typology Code available for payment source | Admitting: Rehabilitative and Restorative Service Providers"

## 2019-12-29 ENCOUNTER — Other Ambulatory Visit: Payer: Self-pay

## 2019-12-29 ENCOUNTER — Encounter: Payer: Self-pay | Admitting: Rehabilitative and Restorative Service Providers"

## 2019-12-29 DIAGNOSIS — H8111 Benign paroxysmal vertigo, right ear: Secondary | ICD-10-CM

## 2019-12-29 DIAGNOSIS — R42 Dizziness and giddiness: Secondary | ICD-10-CM | POA: Diagnosis not present

## 2019-12-29 NOTE — Therapy (Signed)
[place], [place], [address] Phone: [phone]   Fax:  [phone]  Physical Therapy Treatment  Patient Details  Name: Bryan Walls MRN: [id number] Date of Birth: [birth year] Referring Provider (PT): Delorise Shiner, MD   Encounter Date: 12/29/2019   PT End of Session - 12/29/19 1436    Visit Number 3    Number of Visits 6    Date for PT Re-Evaluation 01/20/20    Authorization Type VA authorization x 15 visits    Authorization - Visit Number 3    Authorization - Number of Visits 15    PT Start Time 1700    PT Stop Time 1511    PT Time Calculation (min) 38 min    Activity Tolerance Other (comment)   limited due to nausea   Behavior During Therapy [facility] for tasks assessed/performed           Past Medical History:  Diagnosis Date  . Syndrome X, cardiac [facility])    history    Past Surgical History:  Procedure Laterality Date  . LUMBAR FUSION      There were no vitals filed for this visit.   Subjective Assessment - 12/29/19 1435    Subjective The patient reports his vertigo goes away for 2 days and then returns after therapy.  He is noticing some vertigo with rolling in bed.    Pertinent History back pain, neck pain, 11/2018-- documented 50% R vestibular weakness    Patient Stated Goals get rid of vertigo    Currently in Pain? No/denies                   Vestibular Assessment - 12/29/19 1437      Positional Testing   Dix-Hallpike Dix-Hallpike Right      Dix-Hallpike Right   Dix-Hallpike Right Duration 15 seconds, large amplitude nystagmus    Dix-Hallpike Right Symptoms Upbeat, right rotatory nystagmus                     Vestibular Treatment/Exercise - 12/29/19 1437      Vestibular Treatment/Exercise   Vestibular Treatment Provided Canalith Repositioning;Habituation    Canalith Repositioning Epley Manuever Right   adding vibration today   Habituation  Exercises Nestor Lewandowsky   also performed repeated dix hallpike for home x 2 reps      EPLEY MANUEVER RIGHT   Number of Reps  4    Overall Response Improved Symptoms    Response Details  improved symptom duration, however with repetitive movements during therapy, patient experiences nausea; after 3rd rep, patient had negative dix hallpike, but got nausea with return to sit so performed a 4th epley's maneuver with dizziness and nystagmus in dix hallpike position and when rolling onto the left shoulder.  *stopped after 4th due to patient nausea.      Nestor Lewandowsky   Number of Reps  1    Symptom Description  no dizziness with brandt daroff; repeated dix hallpike x 2 times to the right with pillows provokes significant nausea that takes minutes to settle                      PT Long Term Goals - 12/09/19 1137      PT LONG TERM GOAL #1   Title The patient will return demo HEP for self mgmt positional vertigo, VOR and gaze adaptation.    Time 6    Period Weeks  Target Date 01/20/20      PT LONG TERM GOAL #2   Title The patient will tolerate gaze x 1 adaptation x 60 seconds.    Time 6    Period Weeks    Target Date 01/20/20      PT LONG TERM GOAL #3   Title The patient will have negative positional testing R side to indicate resolution of BPPV.    Time 6    Period Weeks    Target Date 01/20/20                 Plan - 12/29/19 1459    Clinical Impression Statement The patient appears to have improvement for 1-2 days after treatment and then BPPV returns.  His nystagmus follow pattern for BPPV.  He has larger amplitude, longer duration symptoms today with dix hallpike testing and increased nausea.  His symptoms are not provoked with habituation.  PT treated with Epley's adding vibration to R mastoid.    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, h/o chornic neck/back pain    Stability/Clinical Decision Making Stable/Uncomplicated    Rehab Potential Good    PT  Frequency 1x / week    PT Duration 6 weeks    PT Treatment/Interventions ADLs/Self Care Home Management;Canalith Repostioning;Vestibular;Neuromuscular re-education;Balance training;Gait training;Patient/family education    PT Next Visit Plan check R BPPV, progress gaze, add multi-sensory balance challenges to HEP    PT Home Exercise Plan Access Code: [email or web address]    Consulted and Agree with Plan of Care Patient           Patient will benefit from skilled therapeutic intervention in order to improve the following deficits and impairments:  Dizziness, Decreased balance  Visit Diagnosis: BPPV (benign paroxysmal positional vertigo), right  Dizziness and giddiness     Problem List Patient Active Problem List   Diagnosis Date Noted  . Nonallopathic lesion of cervical region 08/13/2017  . Nonallopathic lesion of thoracic region 08/13/2017  . Nonallopathic lesion of sacral region 08/13/2017  . Nonallopathic lesion of lumbosacral region 08/13/2017  . Gout 06/18/2017  . Cervical radiculopathy at C8 05/21/2017  . Cervical pain (neck) 03/26/2017  . Lumbosacral strain 02/09/2017  . Multiple thyroid nodules 12/16/2015  . Synovitis of finger 06/15/2015  . Vitamin D deficiency 01/28/2010  . LOW BACK PAIN, CHRONIC 08/17/2009  . UNSPECIFIED ADJUSTMENT REACTION 02/05/2008    Wenda Vanschaick, PT 12/29/2019, 4:39 PM  [facility] [place], [place], [address] Phone: [phone]   Fax:  [phone]  Name: Bryan Walls MRN: [id number] Date of Birth: 02-23-1966

## 2019-12-31 ENCOUNTER — Encounter: Payer: Self-pay | Admitting: Rehabilitative and Restorative Service Providers"

## 2019-12-31 ENCOUNTER — Other Ambulatory Visit: Payer: Self-pay

## 2019-12-31 ENCOUNTER — Ambulatory Visit (INDEPENDENT_AMBULATORY_CARE_PROVIDER_SITE_OTHER): Payer: No Typology Code available for payment source | Admitting: Rehabilitative and Restorative Service Providers"

## 2019-12-31 DIAGNOSIS — H8111 Benign paroxysmal vertigo, right ear: Secondary | ICD-10-CM | POA: Diagnosis not present

## 2019-12-31 DIAGNOSIS — R42 Dizziness and giddiness: Secondary | ICD-10-CM | POA: Diagnosis not present

## 2019-12-31 NOTE — Therapy (Signed)
[place], [place], [address] Phone: [phone]   Fax:  [phone]  Physical Therapy Treatment  Patient Details  Name: Bryan Walls MRN: [id number] Date of Birth: April 13, 1966 Referring Provider (PT): Delorise Shiner, MD   Encounter Date: 12/31/2019   PT End of Session - 12/31/19 0738    Visit Number 4    Number of Visits 12    Date for PT Re-Evaluation 01/30/20    Authorization Type VA authorization x 15 visits    Authorization - Visit Number 4    Authorization - Number of Visits 15    PT Start Time 0718    PT Stop Time 0756    PT Time Calculation (min) 38 min    Activity Tolerance Other (comment)   limited due to nausea   Behavior During Therapy [facility] for tasks assessed/performed           Past Medical History:  Diagnosis Date  . Syndrome X, cardiac [facility])    history    Past Surgical History:  Procedure Laterality Date  . LUMBAR FUSION      There were no vitals filed for this visit.   Subjective Assessment - 12/31/19 0719    Subjective The patient reports he had to take meclizine after last session.  He hasn't even tried to sleep on the right side.  He has not tested it b/c it was so bad on Monday.  Getting up in the morning has improved since Monday.    Pertinent History back pain, neck pain, 11/2018-- documented 50% R vestibular weakness    Patient Stated Goals get rid of vertigo    Currently in Pain? No/denies                   Vestibular Assessment - 12/31/19 0720      Positional Testing   Dix-Hallpike Dix-Hallpike Right      Dix-Hallpike Right   Dix-Hallpike Right Duration 6 seconds, large amplitude nystagmus    Dix-Hallpike Right Symptoms Upbeat, right rotatory nystagmus                     Vestibular Treatment/Exercise - 12/31/19 0724      Vestibular Treatment/Exercise   Vestibular Treatment Provided Canalith Repositioning    Canalith  Repositioning Epley Manuever Right    Habituation Exercises Nestor Lewandowsky       EPLEY MANUEVER RIGHT   Number of Reps  2    Overall Response Symptoms Resolved    Response Details  no nystagmus on second repetition; used vibration due to chronic nature of symptoms      Semont Procedure Right Posterior   Number of Reps  1    Response Details  nausea and dizziness with return to sitting; was able to provoke upbeat, right rotary  nsytagmus in first position (a R sidelying position).       Nestor Lewandowsky   Symptom Description  *Nestor Lewandowsky does not provoke symptoms of vertigo.  So, modified to do a dix hallpike position with pillows under his back.  Repeated 2 times with rest due to nausea and 7/10 dizziness with return to sit (no dizziness going into positions).  With 2nd rep, patient had less symptoms of dizziness, but increasing nausea (dizziness 5/10).  PT did not prescribe for home b/c of building nausea with exercise.  PT Long Term Goals - 12/31/19 0758      PT LONG TERM GOAL #1   Title The patient will return demo HEP for self mgmt positional vertigo, VOR and gaze adaptation.    Time 6    Period Weeks    Status Revised    Target Date 01/30/20      PT LONG TERM GOAL #2   Title The patient will tolerate gaze x 1 adaptation x 60 seconds.    Time 6    Period Weeks    Status Revised    Target Date 01/30/20      PT LONG TERM GOAL #3   Title The patient will have negative positional testing R side to indicate resolution of BPPV.    Time 6    Period Weeks    Status Revised    Target Date 01/30/20                 Plan - 12/31/19 0740    Clinical Impression Statement The patient continues with persistent BPPV that also has other symptoms that accompany presentation including noise and light sensitivity x hours.  He continues with R posterior canalithiasis that is resistant to CRT maneuvers.  We are adding vibration and semont maneuvers today  + finding tolerable habituation exercises.  *Modified cert order to add 8 visits over the next month*.    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, h/o chornic neck/back pain    Stability/Clinical Decision Making Stable/Uncomplicated    Rehab Potential Good    PT Frequency 2x / week    PT Duration 4 weeks    PT Treatment/Interventions ADLs/Self Care Home Management;Canalith Repostioning;Vestibular;Neuromuscular re-education;Balance training;Gait training;Patient/family education    PT Next Visit Plan check R BPPV, progress gaze, add multi-sensory balance challenges to HEP    PT Home Exercise Plan Access Code: [email or web address]    Consulted and Agree with Plan of Care Patient           Patient will benefit from skilled therapeutic intervention in order to improve the following deficits and impairments:  Dizziness, Decreased balance  Visit Diagnosis: BPPV (benign paroxysmal positional vertigo), right  Dizziness and giddiness     Problem List Patient Active Problem List   Diagnosis Date Noted  . Nonallopathic lesion of cervical region 08/13/2017  . Nonallopathic lesion of thoracic region 08/13/2017  . Nonallopathic lesion of sacral region 08/13/2017  . Nonallopathic lesion of lumbosacral region 08/13/2017  . Gout 06/18/2017  . Cervical radiculopathy at C8 05/21/2017  . Cervical pain (neck) 03/26/2017  . Lumbosacral strain 02/09/2017  . Multiple thyroid nodules 12/16/2015  . Synovitis of finger 06/15/2015  . Vitamin D deficiency 01/28/2010  . LOW BACK PAIN, CHRONIC 08/17/2009  . UNSPECIFIED ADJUSTMENT REACTION 02/05/2008    Merica Prell, PT 12/31/2019, [place], [place], [address] Phone: [phone]   Fax:  [phone]  Name: MAYANK TEUSCHER MRN: [id number] Date of Birth: 03/21/1966

## 2020-01-09 ENCOUNTER — Ambulatory Visit (INDEPENDENT_AMBULATORY_CARE_PROVIDER_SITE_OTHER): Payer: No Typology Code available for payment source | Admitting: Rehabilitative and Restorative Service Providers"

## 2020-01-09 ENCOUNTER — Other Ambulatory Visit: Payer: Self-pay

## 2020-01-09 DIAGNOSIS — H8111 Benign paroxysmal vertigo, right ear: Secondary | ICD-10-CM | POA: Diagnosis not present

## 2020-01-09 NOTE — Therapy (Addendum)
[place] [address] [place], [place], [address] Phone: [phone]   Fax:  [phone]  Physical Therapy Treatment and Discharge Summary  Patient Details  Name: Bryan Walls MRN: [id number] Date of Birth: 06-27-1965 Referring Provider (PT): Delorise Shiner, MD     Patient did not return to PT.  Please refer to initial evaluation for patient status. Thank you for the referral of this patient. Rudell Cobb, MPT   Encounter Date: 01/09/2020   PT End of Session - 01/09/20 1554    Visit Number 5    Number of Visits 12    Date for PT Re-Evaluation 01/30/20    Authorization Type VA authorization x 15 visits    Authorization - Visit Number 5    Authorization - Number of Visits 15    PT Start Time 9833    PT Stop Time 1544    PT Time Calculation (min) 14 min    Activity Tolerance Other (comment)   limited due to nausea   Behavior During Therapy [facility] for tasks assessed/performed           Past Medical History:  Diagnosis Date  . Syndrome X, cardiac [facility])    history    Past Surgical History:  Procedure Laterality Date  . LUMBAR FUSION      There were no vitals filed for this visit.   Subjective Assessment - 01/09/20 1530    Subjective The patient reports he thinks the exercises after doing epley's are making things worse.  The vertigo is not resolved.    Pertinent History back pain, neck pain, 11/2018-- documented 50% R vestibular weakness    Patient Stated Goals get rid of vertigo    Currently in Pain? No/denies              [facility] PT Assessment - 01/09/20 1531      Assessment   Medical Diagnosis Vertigo    Referring Provider (PT) Delorise Shiner, MD    Onset Date/Surgical Date 11/25/19               Vestibular Assessment - 01/09/20 1532      Vestibular Assessment   General Observation The patient continues with vertigo.  His primary ENT is Dr. Zigmund Daniel.        Positional Testing    Dix-Hallpike Dix-Hallpike Right      Dix-Hallpike Right   Dix-Hallpike Right Duration 15 seconds duration, 3 second latency    Dix-Hallpike Right Symptoms Upbeat, right rotatory nystagmus                     Vestibular Treatment/Exercise - 01/09/20 1535      Vestibular Treatment/Exercise   Vestibular Treatment Provided Canalith Repositioning    Canalith Repositioning Epley Manuever Right       EPLEY MANUEVER RIGHT   Number of Reps  2    Overall Response Symptoms Resolved    Response Details  no nystagmus on second repetition                 PT Education - 01/09/20 1554    Education Details recommended the patient remain more upright x 2 days after epley's for sleep (not current evidence, but trying due to recurrence of symptoms)    Person(s) Educated Patient    Methods Explanation;Demonstration;Handout    Comprehension Verbalized understanding               PT Long Term Goals - 12/31/19 8250  PT LONG TERM GOAL #1   Title The patient will return demo HEP for self mgmt positional vertigo, VOR and gaze adaptation.    Time 6    Period Weeks    Status Revised    Target Date 01/30/20      PT LONG TERM GOAL #2   Title The patient will tolerate gaze x 1 adaptation x 60 seconds.    Time 6    Period Weeks    Status Revised    Target Date 01/30/20      PT LONG TERM GOAL #3   Title The patient will have negative positional testing R side to indicate resolution of BPPV.    Time 6    Period Weeks    Status Revised    Target Date 01/30/20                 Plan - 01/09/20 1555    Clinical Impression Statement The patient continues with persistent R BPPV on exam today.  Symptoms resolve after 1 repetition of Epley's (2nd rep performed to move through repositioning after dix hallpike).  Plan to recheck in 10 days and discussed scheduling ENT visit if symtpoms persist.    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, h/o chornic neck/back pain     Stability/Clinical Decision Making Stable/Uncomplicated    Rehab Potential Good    PT Frequency 2x / week    PT Duration 4 weeks    PT Treatment/Interventions ADLs/Self Care Home Management;Canalith Repostioning;Vestibular;Neuromuscular re-education;Balance training;Gait training;Patient/family education    PT Next Visit Plan check R BPPV, progress gaze, add multi-sensory balance challenges to HEP    PT Home Exercise Plan Access Code: [email or web address]    Consulted and Agree with Plan of Care Patient           Patient will benefit from skilled therapeutic intervention in order to improve the following deficits and impairments:  Dizziness, Decreased balance  Visit Diagnosis: BPPV (benign paroxysmal positional vertigo), right     Problem List Patient Active Problem List   Diagnosis Date Noted  . Nonallopathic lesion of cervical region 08/13/2017  . Nonallopathic lesion of thoracic region 08/13/2017  . Nonallopathic lesion of sacral region 08/13/2017  . Nonallopathic lesion of lumbosacral region 08/13/2017  . Gout 06/18/2017  . Cervical radiculopathy at C8 05/21/2017  . Cervical pain (neck) 03/26/2017  . Lumbosacral strain 02/09/2017  . Multiple thyroid nodules 12/16/2015  . Synovitis of finger 06/15/2015  . Vitamin D deficiency 01/28/2010  . LOW BACK PAIN, CHRONIC 08/17/2009  . UNSPECIFIED ADJUSTMENT REACTION 02/05/2008    Jaylon Boylen , PT 01/09/2020, 3:58 PM  [facility] [place], [place], [address] Phone: [phone]   Fax:  [phone]  Name: Bryan Walls MRN: [id number] Date of Birth: 1965/05/16

## 2020-01-19 ENCOUNTER — Encounter: Payer: 59 | Admitting: Rehabilitative and Restorative Service Providers"

## 2020-11-01 DIAGNOSIS — I10 Essential (primary) hypertension: Secondary | ICD-10-CM | POA: Insufficient documentation

## 2021-01-06 ENCOUNTER — Ambulatory Visit (INDEPENDENT_AMBULATORY_CARE_PROVIDER_SITE_OTHER): Payer: 59

## 2021-01-06 ENCOUNTER — Other Ambulatory Visit: Payer: Self-pay

## 2021-01-06 ENCOUNTER — Ambulatory Visit: Payer: 59 | Admitting: Sports Medicine

## 2021-01-06 DIAGNOSIS — M79644 Pain in right finger(s): Secondary | ICD-10-CM | POA: Diagnosis not present

## 2021-01-06 DIAGNOSIS — M7989 Other specified soft tissue disorders: Secondary | ICD-10-CM | POA: Insufficient documentation

## 2021-01-06 MED ORDER — MELOXICAM 15 MG PO TABS: ORAL_TABLET | ORAL | 3 refills | Status: DC

## 2021-01-06 NOTE — Assessment & Plan Note (Signed)
This is a pleasant 55 year old male, 3 weeks ago he was playing basketball and jammed his right index finger, he has continued to have swelling, pain at the MCP. On exam he has moderate swelling at the MCP, no tenderness at the PIP or the DIP, good strength to flexion and extension at the DIP, MCP, PIP. We will get some x-rays to ensure no fracture at the MCP, I have buddy tape the second and third fingers together, adding meloxicam. Return to see me in 2 weeks. I do suspect physical therapy will be needed over the next month or so.

## 2021-01-06 NOTE — Progress Notes (Signed)
    Procedures performed today:    None.  Independent interpretation of notes and tests performed by another provider:   None.  Brief History, Exam, Impression, and Recommendations:    Swelling of right index finger This is a pleasant 55 year old male, 3 weeks ago he was playing basketball and jammed his right index finger, he has continued to have swelling, pain at the MCP. On exam he has moderate swelling at the MCP, no tenderness at the PIP or the DIP, good strength to flexion and extension at the DIP, MCP, PIP. We will get some x-rays to ensure no fracture at the MCP, I have buddy tape the second and third fingers together, adding meloxicam. Return to see me in 2 weeks. I do suspect physical therapy will be needed over the next month or so.    ___________________________________________ Gwen Her. Dianah Field, M.D., ABFM., CAQSM. Primary Care and [place] Instructor of [place] of [facility] of Medicine

## 2021-01-20 ENCOUNTER — Ambulatory Visit: Payer: 59 | Admitting: Sports Medicine

## 2021-09-05 ENCOUNTER — Emergency Department (INDEPENDENT_AMBULATORY_CARE_PROVIDER_SITE_OTHER): Payer: 59

## 2021-09-05 ENCOUNTER — Emergency Department (INDEPENDENT_AMBULATORY_CARE_PROVIDER_SITE_OTHER)
Admission: EM | Admit: 2021-09-05 | Discharge: 2021-09-05 | Disposition: A | Discharge status: Home / Self Care | Payer: 59 | Source: Home / Self Care

## 2021-09-05 DIAGNOSIS — M79644 Pain in right finger(s): Secondary | ICD-10-CM | POA: Diagnosis not present

## 2021-09-05 DIAGNOSIS — M25531 Pain in right wrist: Secondary | ICD-10-CM | POA: Diagnosis not present

## 2021-09-05 DIAGNOSIS — M79641 Pain in right hand: Secondary | ICD-10-CM

## 2021-09-05 DIAGNOSIS — M79631 Pain in right forearm: Secondary | ICD-10-CM

## 2021-09-05 DIAGNOSIS — M79601 Pain in right arm: Secondary | ICD-10-CM | POA: Diagnosis not present

## 2021-09-05 HISTORY — DX: Essential (primary) hypertension: I10

## 2021-09-05 NOTE — Discharge Instructions (Addendum)
5/informed patient/wife of x-ray results today with hard copies provided.  Advised patient to take medication as directed with food to completion.  Advised patient to wear right middle finger splint 24/7 until follow-up with orthopedic provider for further evaluation.  [facility] orthopedic provider contact information provided above.  Advised patient may use Tylenol for pain. ?
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
## 2021-09-12 ENCOUNTER — Encounter: Payer: Self-pay | Admitting: Family Medicine

## 2021-09-12 ENCOUNTER — Ambulatory Visit: Payer: Self-pay

## 2021-09-12 ENCOUNTER — Ambulatory Visit: Payer: 59 | Admitting: Family Medicine

## 2021-09-12 VITALS — BP 122/84 | Ht 71.0 in

## 2021-09-12 DIAGNOSIS — S63630A Sprain of interphalangeal joint of right index finger, initial encounter: Secondary | ICD-10-CM

## 2021-09-12 DIAGNOSIS — S63501D Unspecified sprain of right wrist, subsequent encounter: Secondary | ICD-10-CM | POA: Insufficient documentation

## 2021-09-12 DIAGNOSIS — S63399A Traumatic rupture of other ligament of unspecified wrist, initial encounter: Secondary | ICD-10-CM | POA: Insufficient documentation

## 2021-09-12 DIAGNOSIS — S63501A Unspecified sprain of right wrist, initial encounter: Secondary | ICD-10-CM | POA: Diagnosis not present

## 2021-09-12 DIAGNOSIS — M79641 Pain in right hand: Secondary | ICD-10-CM

## 2021-09-12 NOTE — Patient Instructions (Signed)
Nice to meet you ?Please use ice as needed  ?Please use the splint  ?Please try the exercise   ?Please send me a message in MyChart with any questions or updates.  ?Please see me back in 2-3 weeks.  ? ?--Dr. Raeford Razor ? ?
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
## 2021-10-10 ENCOUNTER — Ambulatory Visit: Payer: 59 | Admitting: Family Medicine

## 2021-10-10 VITALS — BP 120/80 | Ht 71.0 in | Wt 200.0 lb

## 2021-10-10 DIAGNOSIS — S63630D Sprain of interphalangeal joint of right index finger, subsequent encounter: Secondary | ICD-10-CM

## 2021-10-10 DIAGNOSIS — S63501D Unspecified sprain of right wrist, subsequent encounter: Secondary | ICD-10-CM | POA: Diagnosis not present
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
## 2021-11-14 ENCOUNTER — Encounter: Payer: Self-pay | Admitting: Family Medicine

## 2021-11-14 ENCOUNTER — Ambulatory Visit (INDEPENDENT_AMBULATORY_CARE_PROVIDER_SITE_OTHER): Payer: 59 | Admitting: Family Medicine

## 2021-11-14 VITALS — BP 130/80 | Ht 71.0 in | Wt 198.0 lb

## 2021-11-14 DIAGNOSIS — S63630D Sprain of interphalangeal joint of right index finger, subsequent encounter: Secondary | ICD-10-CM | POA: Diagnosis not present

## 2021-11-14 DIAGNOSIS — S63399A Traumatic rupture of other ligament of unspecified wrist, initial encounter: Secondary | ICD-10-CM
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
## 2021-11-14 NOTE — Patient Instructions (Signed)
Good to see you We will obtain an MRI at [facility]. Please send me a message in MyChart with any questions or updates.  We will schedule a virtual visit once the MRI is resulted..   --Dr. Raeford Razor

## 2021-11-16 ENCOUNTER — Encounter: Payer: Self-pay | Admitting: Family Medicine

## 2021-11-21 ENCOUNTER — Ambulatory Visit: Payer: 59 | Admitting: Sports Medicine

## 2021-11-21 ENCOUNTER — Other Ambulatory Visit: Payer: 59

## 2021-11-29 ENCOUNTER — Other Ambulatory Visit: Payer: Self-pay | Admitting: Family Medicine

## 2021-11-29 DIAGNOSIS — S63399A Traumatic rupture of other ligament of unspecified wrist, initial encounter: Secondary | ICD-10-CM

## 2021-12-12 ENCOUNTER — Ambulatory Visit
Admission: RE | Admit: 2021-12-12 | Discharge: 2021-12-12 | Disposition: A | Discharge status: Home / Self Care | Payer: 59 | Source: Ambulatory Visit | Attending: Family Medicine | Admitting: Family Medicine

## 2021-12-12 DIAGNOSIS — S63399A Traumatic rupture of other ligament of unspecified wrist, initial encounter: Secondary | ICD-10-CM

## 2021-12-12 MED ORDER — IOPAMIDOL (ISOVUE-M 200) INJECTION 41%
1.5000 mL | Freq: Once | INTRAMUSCULAR | Status: AC
  Administered 2021-12-12: 1.5 mL via INTRA_ARTICULAR

## 2021-12-14 ENCOUNTER — Encounter: Payer: Self-pay | Admitting: Family Medicine

## 2021-12-14 ENCOUNTER — Telehealth (INDEPENDENT_AMBULATORY_CARE_PROVIDER_SITE_OTHER): Payer: 59 | Admitting: Family Medicine

## 2021-12-14 DIAGNOSIS — S63399A Traumatic rupture of other ligament of unspecified wrist, initial encounter: Secondary | ICD-10-CM
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
## 2021-12-16 ENCOUNTER — Encounter: Payer: Self-pay | Admitting: Family Medicine

## 2022-08-21 ENCOUNTER — Encounter: Payer: Self-pay | Admitting: *Deleted

## 2022-09-27 ENCOUNTER — Telehealth: Payer: Self-pay | Admitting: Family Medicine

## 2022-09-27 NOTE — Telephone Encounter (Signed)
Patient called he is requesting to see you as a new patient please advise

## 2022-09-27 NOTE — Telephone Encounter (Signed)
Ok to schedule.

## 2022-11-21 ENCOUNTER — Ambulatory Visit: Payer: 59 | Admitting: Family Medicine

## 2022-11-21 ENCOUNTER — Encounter: Payer: Self-pay | Admitting: Family Medicine

## 2022-11-21 VITALS — BP 126/84 | HR 72 | Ht 72.0 in | Wt 195.0 lb

## 2022-11-21 DIAGNOSIS — M1A30X0 Chronic gout due to renal impairment, unspecified site, without tophus (tophi): Secondary | ICD-10-CM | POA: Diagnosis not present

## 2022-11-21 DIAGNOSIS — E042 Nontoxic multinodular goiter: Secondary | ICD-10-CM | POA: Diagnosis not present

## 2022-11-21 DIAGNOSIS — I10 Essential (primary) hypertension: Secondary | ICD-10-CM | POA: Diagnosis not present

## 2022-11-21 DIAGNOSIS — N1831 Chronic kidney disease, stage 3a: Secondary | ICD-10-CM | POA: Diagnosis not present
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
## 2022-11-21 NOTE — Assessment & Plan Note (Signed)
Not on prophylaxis.

## 2022-11-21 NOTE — Assessment & Plan Note (Signed)
Blood pressure is at goal today.  Continue with amlodipine 5 mg daily.  Currently prescribed by the [place].

## 2022-11-21 NOTE — Addendum Note (Signed)
Addended by: Nani Gasser D on: 11/21/2022 05:42 PM   Modules accepted: Level of Service

## 2022-11-21 NOTE — Assessment & Plan Note (Signed)
Labs are followed at the [place].  He says he also has protein testing done on his urine.

## 2022-11-21 NOTE — Assessment & Plan Note (Signed)
Follows with endocrinology at the [place].
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
## 2024-01-08 ENCOUNTER — Encounter: Payer: Self-pay | Admitting: Sports Medicine
